# Patient Record
Sex: Male | Born: 1960 | Race: White | Hispanic: No | Smoking: Never smoker
Health system: Southern US, Community
[De-identification: ages and names within clinical notes are randomized; demographics above are authoritative.]

## PROBLEM LIST (undated history)

## (undated) DIAGNOSIS — F419 Anxiety disorder, unspecified: Secondary | ICD-10-CM

## (undated) DIAGNOSIS — F4310 Post-traumatic stress disorder, unspecified: Secondary | ICD-10-CM

## (undated) DIAGNOSIS — F639 Impulse disorder, unspecified: Secondary | ICD-10-CM

## (undated) DIAGNOSIS — S069X9A Unspecified intracranial injury with loss of consciousness of unspecified duration, initial encounter: Secondary | ICD-10-CM

## (undated) DIAGNOSIS — F329 Major depressive disorder, single episode, unspecified: Secondary | ICD-10-CM

## (undated) DIAGNOSIS — F32A Depression, unspecified: Secondary | ICD-10-CM

## (undated) DIAGNOSIS — S069XAA Unspecified intracranial injury with loss of consciousness status unknown, initial encounter: Secondary | ICD-10-CM

## (undated) DIAGNOSIS — K5792 Diverticulitis of intestine, part unspecified, without perforation or abscess without bleeding: Secondary | ICD-10-CM

## (undated) DIAGNOSIS — E039 Hypothyroidism, unspecified: Secondary | ICD-10-CM

## (undated) DIAGNOSIS — I4891 Unspecified atrial fibrillation: Secondary | ICD-10-CM

## (undated) HISTORY — PX: MOUTH SURGERY: SHX715

## (undated) HISTORY — DX: Unspecified atrial fibrillation: I48.91

## (undated) HISTORY — DX: Impulse disorder, unspecified: F63.9

## (undated) HISTORY — DX: Diverticulitis of intestine, part unspecified, without perforation or abscess without bleeding: K57.92

## (undated) HISTORY — DX: Post-traumatic stress disorder, unspecified: F43.10

## (undated) HISTORY — PX: HERNIA REPAIR: SHX51

---

## 2014-07-12 DIAGNOSIS — M7540 Impingement syndrome of unspecified shoulder: Secondary | ICD-10-CM | POA: Insufficient documentation

## 2014-07-12 DIAGNOSIS — M1711 Unilateral primary osteoarthritis, right knee: Secondary | ICD-10-CM

## 2014-07-12 DIAGNOSIS — M25512 Pain in left shoulder: Secondary | ICD-10-CM | POA: Insufficient documentation

## 2014-07-12 DIAGNOSIS — M25511 Pain in right shoulder: Secondary | ICD-10-CM

## 2014-07-12 HISTORY — DX: Impingement syndrome of unspecified shoulder: M75.40

## 2014-07-12 HISTORY — DX: Pain in right shoulder: M25.511

## 2014-07-12 HISTORY — DX: Unilateral primary osteoarthritis, right knee: M17.11

## 2017-08-11 DIAGNOSIS — N529 Male erectile dysfunction, unspecified: Secondary | ICD-10-CM | POA: Insufficient documentation

## 2017-08-11 DIAGNOSIS — Z1159 Encounter for screening for other viral diseases: Secondary | ICD-10-CM

## 2017-08-11 DIAGNOSIS — E291 Testicular hypofunction: Secondary | ICD-10-CM

## 2017-08-11 DIAGNOSIS — R5382 Chronic fatigue, unspecified: Secondary | ICD-10-CM

## 2017-08-11 DIAGNOSIS — E039 Hypothyroidism, unspecified: Secondary | ICD-10-CM

## 2017-08-11 DIAGNOSIS — Z888 Allergy status to other drugs, medicaments and biological substances status: Secondary | ICD-10-CM | POA: Insufficient documentation

## 2017-08-11 DIAGNOSIS — Z6841 Body Mass Index (BMI) 40.0 and over, adult: Secondary | ICD-10-CM

## 2017-08-11 DIAGNOSIS — J309 Allergic rhinitis, unspecified: Secondary | ICD-10-CM | POA: Insufficient documentation

## 2017-08-11 HISTORY — DX: Testicular hypofunction: E29.1

## 2017-08-11 HISTORY — DX: Hypothyroidism, unspecified: E03.9

## 2017-08-11 HISTORY — DX: Allergic rhinitis, unspecified: J30.9

## 2017-08-11 HISTORY — DX: Allergy status to other drugs, medicaments and biological substances: Z88.8

## 2017-08-11 HISTORY — DX: Body Mass Index (BMI) 40.0 and over, adult: Z684

## 2017-08-11 HISTORY — DX: Encounter for screening for other viral diseases: Z11.59

## 2017-08-11 HISTORY — DX: Male erectile dysfunction, unspecified: N52.9

## 2017-08-11 HISTORY — DX: Chronic fatigue, unspecified: R53.82

## 2017-08-11 HISTORY — DX: Morbid (severe) obesity due to excess calories: E66.01

## 2017-09-02 DIAGNOSIS — J300 Vasomotor rhinitis: Secondary | ICD-10-CM

## 2017-09-02 HISTORY — DX: Vasomotor rhinitis: J30.0

## 2017-10-30 DIAGNOSIS — G8929 Other chronic pain: Secondary | ICD-10-CM | POA: Insufficient documentation

## 2017-10-30 DIAGNOSIS — M25562 Pain in left knee: Secondary | ICD-10-CM | POA: Insufficient documentation

## 2017-10-30 HISTORY — DX: Other chronic pain: G89.29

## 2018-03-21 ENCOUNTER — Other Ambulatory Visit: Payer: Self-pay

## 2018-03-21 ENCOUNTER — Emergency Department (HOSPITAL_COMMUNITY): Payer: Self-pay

## 2018-03-21 ENCOUNTER — Encounter (HOSPITAL_COMMUNITY): Payer: Self-pay | Admitting: Emergency Medicine

## 2018-03-21 ENCOUNTER — Emergency Department (HOSPITAL_COMMUNITY)
Admission: EM | Admit: 2018-03-21 | Discharge: 2018-03-22 | Disposition: A | Discharge status: Home / Self Care | Payer: Self-pay | Attending: Emergency Medicine | Admitting: Emergency Medicine

## 2018-03-21 ENCOUNTER — Ambulatory Visit (HOSPITAL_COMMUNITY)
Admission: RE | Admit: 2018-03-21 | Discharge: 2018-03-21 | Disposition: A | Discharge status: Home / Self Care | Payer: Federal, State, Local not specified - Other | Attending: Psychiatry | Admitting: Psychiatry

## 2018-03-21 DIAGNOSIS — R45851 Suicidal ideations: Secondary | ICD-10-CM | POA: Insufficient documentation

## 2018-03-21 DIAGNOSIS — Z008 Encounter for other general examination: Secondary | ICD-10-CM | POA: Insufficient documentation

## 2018-03-21 DIAGNOSIS — F322 Major depressive disorder, single episode, severe without psychotic features: Secondary | ICD-10-CM | POA: Insufficient documentation

## 2018-03-21 DIAGNOSIS — G43809 Other migraine, not intractable, without status migrainosus: Secondary | ICD-10-CM | POA: Insufficient documentation

## 2018-03-21 DIAGNOSIS — F4321 Adjustment disorder with depressed mood: Secondary | ICD-10-CM | POA: Insufficient documentation

## 2018-03-21 DIAGNOSIS — Z87820 Personal history of traumatic brain injury: Secondary | ICD-10-CM | POA: Insufficient documentation

## 2018-03-21 HISTORY — DX: Unspecified intracranial injury with loss of consciousness of unspecified duration, initial encounter: S06.9X9A

## 2018-03-21 HISTORY — DX: Unspecified intracranial injury with loss of consciousness status unknown, initial encounter: S06.9XAA

## 2018-03-21 LAB — CBC WITH DIFFERENTIAL/PLATELET
BASOS PCT: 0 %
Basophils Absolute: 0 10*3/uL (ref 0.0–0.1)
Eosinophils Absolute: 0.1 10*3/uL (ref 0.0–0.7)
Eosinophils Relative: 2 %
HEMATOCRIT: 46 % (ref 39.0–52.0)
HEMOGLOBIN: 15.2 g/dL (ref 13.0–17.0)
LYMPHS ABS: 1.5 10*3/uL (ref 0.7–4.0)
LYMPHS PCT: 20 %
MCH: 29.5 pg (ref 26.0–34.0)
MCHC: 33 g/dL (ref 30.0–36.0)
MCV: 89.1 fL (ref 78.0–100.0)
Monocytes Absolute: 0.6 10*3/uL (ref 0.1–1.0)
Monocytes Relative: 8 %
NEUTROS ABS: 5.3 10*3/uL (ref 1.7–7.7)
NEUTROS PCT: 70 %
Platelets: 230 10*3/uL (ref 150–400)
RBC: 5.16 MIL/uL (ref 4.22–5.81)
RDW: 13.7 % (ref 11.5–15.5)
WBC: 7.5 10*3/uL (ref 4.0–10.5)

## 2018-03-21 LAB — COMPREHENSIVE METABOLIC PANEL
ALBUMIN: 3.9 g/dL (ref 3.5–5.0)
ALK PHOS: 76 U/L (ref 38–126)
ALT: 15 U/L (ref 0–44)
AST: 13 U/L — AB (ref 15–41)
Anion gap: 6 (ref 5–15)
BILIRUBIN TOTAL: 1 mg/dL (ref 0.3–1.2)
BUN: 20 mg/dL (ref 6–20)
CALCIUM: 8.9 mg/dL (ref 8.9–10.3)
CO2: 26 mmol/L (ref 22–32)
Chloride: 110 mmol/L (ref 98–111)
Creatinine, Ser: 0.84 mg/dL (ref 0.61–1.24)
GFR calc Af Amer: >60 mL/min (ref >60)
GLUCOSE: 100 mg/dL — AB (ref 70–99)
Potassium: 4.1 mmol/L (ref 3.5–5.1)
Sodium: 142 mmol/L (ref 135–145)
TOTAL PROTEIN: 6.9 g/dL (ref 6.5–8.1)

## 2018-03-21 LAB — RAPID URINE DRUG SCREEN, HOSP PERFORMED
Amphetamines: NOT DETECTED
BARBITURATES: NOT DETECTED
Benzodiazepines: NOT DETECTED
Cocaine: NOT DETECTED
Opiates: NOT DETECTED
Tetrahydrocannabinol: NOT DETECTED

## 2018-03-21 LAB — ETHANOL

## 2018-03-21 MED ORDER — KETOROLAC TROMETHAMINE 30 MG/ML IJ SOLN
15.0000 mg | Freq: Once | INTRAMUSCULAR | Status: AC
  Administered 2018-03-21: 15 mg via INTRAVENOUS
  Filled 2018-03-21: qty 1
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
## 2018-03-21 NOTE — ED Notes (Signed)
Pt has been [facility].
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
## 2018-03-21 NOTE — ED Notes (Signed)
ED Provider  At bedside.

## 2018-03-21 NOTE — ED Notes (Signed)
[facility] while ED RN [facility] for [facility].

## 2018-03-21 NOTE — Progress Notes (Addendum)
Patient was seen by me face-to-face and presented in the lobby as a walk-in with mobile crisis.  Patient had documented on his paper that he is having increasing right-sided head pain that has been worsening over time.  Informed patient that he need to be transported to the emergency room to be medically cleared.  AC was notified and EMS was contacted to [facility].  AC notified [facility] ED the patient was being transported over with complaint of right-sided head pain.  Marland Kitchen..Agree with NP Progress Note

## 2018-03-21 NOTE — ED Notes (Signed)
Bed: WHALD Expected date:  Expected time:  Means of arrival:  Comments: 

## 2018-03-21 NOTE — ED Notes (Signed)
Hot meal provided to patient.

## 2018-03-21 NOTE — Progress Notes (Signed)
PT IS IN HALLWAY BED, UNABLE TO ASSESS PT IN THE HALLWAY DUE TO CONFIDENTIALITY. PT'S NURSE SARAH, RN STATES THE PT IS IN THE PROCESS OF BEING TRANSFERRED TO TCU. TTS TO ASSESS ONCE PT IS IN A PRIVATE ROOM.  Ricky [name], MSW, LCSW Therapeutic Triage Specialist  [phone]

## 2018-03-21 NOTE — ED Notes (Signed)
Bed: WLPT3 Expected date:  Expected time:  Means of arrival:  Comments: 

## 2018-03-21 NOTE — ED Notes (Signed)
Bed: [id number] Expected date:  Expected time:  Means of arrival:  Comments: [place] [facility] [place] [place]

## 2018-03-21 NOTE — ED Notes (Signed)
Pt has 1x White patient belonging bag and 1x black medium duffel bag with brown handle placed at RN desk with patient labels on both. Currently no [employer] Tags available. Charge RN made aware.

## 2018-03-21 NOTE — ED Notes (Signed)
RN went in to discharge pt and patient stated that if he was discharged he "would kill himself" RN made MD aware

## 2018-03-21 NOTE — ED Notes (Signed)
ED Provider at bedside. 

## 2018-03-21 NOTE — Progress Notes (Signed)
TTS consulted with Ricky Mornharles Kober, PA who recommends inpt treatment. [facility] is reviewing for possible admission pending review of UDS and BAL. EDP Ricky [name], Ricky CowerJason, MD and pt's nurse [name], RN have been advised.  Ricky [name], MSW, LCSW Therapeutic Triage Specialist  [phone]

## 2018-03-22 DIAGNOSIS — F4321 Adjustment disorder with depressed mood: Secondary | ICD-10-CM

## 2018-03-22 HISTORY — DX: Adjustment disorder with depressed mood: F43.21

## 2018-03-22 NOTE — Clinical Social Work Note (Signed)
Clinical Social Work Assessment  Patient Details  Name: Ricky [name] MRN: [id number] Date of Birth: 06-Aug-1961  Date of referral:  03/22/18               Reason for consult:  Housing Concerns/Homelessness                Permission sought to share information with:  Other(Community Contacts and referrals) Permission granted to share information::  Yes, Verbal Permission Granted  Name::        Agency::     Relationship::     Contact Information:     Housing/Transportation Living arrangements for the past 2 months:  No permanent address, Hotel/Motel, Single Family Home Source of Information:  Patient Patient Interpreter Needed:  None Criminal Activity/Legal Involvement Pertinent to Current Situation/Hospitalization:  No - Comment as needed Significant Relationships:  Siblings(Sister in [place]) Lives with:  Other (Comment) Do you feel safe going back to the place where you live?  No Need for family participation in patient care:  No (Coment)  Care giving concerns:  Patient requested social work consult for "help," primarily situational and related to homelessness and unstable housing. Patient is medically and psychiatrically cleared and is adequate for discharge.  Social Worker assessment / plan:   CSW met with patient at bedside per patient request. CSW to complete assessment and offered to complete VI-SPDAT with patient in an effort to secure long term housing. Patient declined to complete VI-SPDAT.  Patient has been homeless for approximately six months or more. Patient reports "staying here and there." Patient recently stayed with his sister in [place] for two weeks until Friday, 03/20/18. Patient reports that he left his sister's home "to get [psychiatric] help," and that his sister did not support his decision. Patient reports that he is now unwelcome to stay with his sister and stayed in a motel the past two nights (03/20/18-03/21/18).   Patient initially declined to complete  VI-SPDAT and shelter resources for [facility] and [facility], stating he wants to get back to [employer] where his boys are.   CSW met with patient again to offer [facility] and [facility] homeless resources in addition to resources for [employer] in [place] and a bus passes for [facility] and [facility] with a printed schedule.   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 Patient thanked social work for her time and efforts.  Patient/Family's Understanding of and Emotional Response to Diagnosis, Current Treatment, and Prognosis:  CSW and patient reviewed printed resource lists, bus passes and schedules ([facility] does not operate on weekends), and services available through [facility]. Patient verbalized understanding and has no further questions.   Emotional Assessment Appearance:  Appears stated age Attitude/Demeanor/Rapport:  Engaged Affect (typically observed):  Sad, Hopeless, Anxious, Appropriate, Frustrated Orientation:  Oriented to Self, Oriented to Place, Oriented to  Time, Oriented to Situation Alcohol / Substance use:  Not Applicable Psych involvement (Current and /or in the community):  No (Comment)  Discharge Needs  Concerns to be addressed:  Homelessness, Mental Health Concerns Readmission within the last 30 days:  No Current discharge risk:  Homeless Barriers to Discharge:  No Barriers Identified   Joellen Jersey, Potter 03/22/2018, 11:54 AM

## 2018-03-22 NOTE — BH Assessment (Signed)
[facility] Assessment Progress Note   Per Nanine MeansJamison Lord and Dr. Jama Flavorsobos, Social Work Consult ordered.  Patient requesting resources for housing.  After patient meets with social work, he can be discharged.

## 2018-03-22 NOTE — Consult Note (Addendum)
[facility] Psych ED Discharge  03/22/2018 11:50 AM Ricky [name]  MRN:  [id number] Principal Problem: Adjustment disorder with depressed mood Discharge Diagnoses:  Patient Active Problem List   Diagnosis Date Noted  . Adjustment disorder with depressed mood [F43.21] 03/22/2018    Priority: High    Subjective: 57 yo male who presented to the ED with requests for help with housing and mental health issues.  He reports feeling suicidal when he is stressed.  Anxious with an increase due to financial issues, decreases with support.  3/10 anxiety at this time with mild depression.  He is now homeless after a divorce six months agoa and left [facility] [name] to come here but shelters were full. Met with Education officer, museum but declined help for shelter, wants to return to [facility] [name].  No suicidal/homicidal ideations, hallucinations, or substance abuse.  Stable for discharge.  Total Time spent with patient: 45 minutes  Past Psychiatric History: TBI  Past Medical History:  Past Medical History:  Diagnosis Date  . TBI (traumatic brain injury) [facility])    History reviewed. No pertinent surgical history. Family History: No family history on file. Family Psychiatric  History: none Social History:  Social History   Substance and Sexual Activity  Alcohol Use Not on file    Social History   Substance and Sexual Activity  Drug Use Not on file   Social History   Socioeconomic History  . Marital status: Divorced    Spouse name: Not on file  . Number of children: Not on file  . Years of education: Not on file  . Highest education level: Not on file  Occupational History  . Not on file  Social Needs  . Financial resource strain: Not on file  . Food insecurity:    Worry: Not on file    Inability: Not on file  . Transportation needs:    Medical: Not on file    Non-medical: Not on file  Tobacco Use  . Smoking status: Not on file  Substance and Sexual Activity  . Alcohol use: Not on file  . Drug use: Not on  file  . Sexual activity: Not on file  Lifestyle  . Physical activity:    Days per week: Not on file    Minutes per session: Not on file  . Stress: Not on file  Relationships  . Social connections:    Talks on phone: Not on file    Gets together: Not on file    Attends religious service: Not on file    Active member of club or organization: Not on file    Attends meetings of clubs or organizations: Not on file    Relationship status: Not on file  Other Topics Concern  . Not on file  Social History Narrative  . Not on file    Has this patient used any form of tobacco in the last 30 days? (Cigarettes, Smokeless Tobacco, Cigars, and/or Pipes)None--denies  Current Medications: No current facility-administered medications for this encounter.    Current Outpatient Medications  Medication Sig Dispense Refill  . fluticasone (FLONASE) 50 MCG/ACT nasal spray Place 2 sprays into both nostrils 2 (two) times daily.    Marland Kitchen levothyroxine (SYNTHROID, LEVOTHROID) 50 MCG tablet Take 50 mcg by mouth daily.  99  . testosterone cypionate (DEPOTESTOSTERONE CYPIONATE) 200 MG/ML injection Inject 1 mL into the muscle every 14 (fourteen) days.  3   PTA Medications:  (Not in a hospital admission)  Musculoskeletal: Strength & Muscle Tone: within normal  limits Gait & Station: normal Patient leans: N/A  Psychiatric Specialty Exam: Physical Exam  Nursing note and vitals reviewed. Constitutional: He is oriented to person, place, and time. He appears well-developed and well-nourished.  HENT:  Head: Normocephalic.  Neck: Normal range of motion.  Respiratory: Effort normal.  Musculoskeletal: Normal range of motion.  Neurological: He is alert and oriented to person, place, and time.  Psychiatric: His speech is normal and behavior is normal. Judgment and thought content normal. His mood appears anxious. His affect is blunt. Cognition and memory are normal.    Review of Systems  Psychiatric/Behavioral:  The patient is nervous/anxious.   All other systems reviewed and are negative.   Blood pressure 114/81, pulse 62, temperature 97.6 F (36.4 C), temperature source Oral, resp. rate 18, SpO2 98 %.There is no height or weight on file to calculate BMI.  General Appearance: Casual  Eye Contact:  Good  Speech:  Normal Rate  Volume:  Normal  Mood:  Anxious  Affect:  Congruent  Thought Process:  Coherent and Descriptions of Associations: Intact  Orientation:  Full (Time, Place, and Person)  Thought Content:  WDL and Logical  Suicidal Thoughts:  No  Homicidal Thoughts:  No  Memory:  Immediate;   Fair Recent;   Fair Remote;   Fair  Judgement:  Fair  Insight:  Fair  Psychomotor Activity:  Normal  Concentration:  Concentration: Fair and Attention Span: Fair  Recall:  [employer] of Knowledge:  Fair  Language:  Good  Akathisia:  No  Handed:  Right  AIMS (if indicated):     Assets:  Leisure Time Physical Health Resilience  ADL's:  Intact  Cognition:  Impaired,  Mild  Sleep:        Demographic Factors:  Male and Caucasian  Loss Factors: Financial problems/change in socioeconomic status  Historical Factors: NA  Risk Reduction Factors:   Sense of responsibility to family and Positive social support  Continued Clinical Symptoms:  Anxiety, mild  Cognitive Features That Contribute To Risk:  None    Suicide Risk:  Minimal: No identifiable suicidal ideation.  Patients presenting with no risk factors but with morbid ruminations; may be classified as minimal risk based on the severity of the depressive symptoms  Follow-up Information    Schedule an appointment as soon as possible for a visit  with Your primary provider.   Why:  Refer to phone number below to obtain PCP if needed          Plan Of Care/Follow-up recommendations:  Activity:  as toleraetd Diet:  heart healthy diet  Disposition: discharge home Waylan Boga, NP 03/22/2018, 11:50 AM   Agree with NP  assessment

## 2018-03-22 NOTE — Progress Notes (Signed)
CSW met with patient at bedside per patient request. CSW to complete assessment and offered to complete VI-SPDAT with patient in an effort to secure long term housing. Patient declined to complete VI-SPDAT, as he wishes to return to [facility].  CSW met with patient again to offer [facility] homeless resources in addition to resources for [employer] in [place] and a bus passes for [employer] and [facility] with a printed schedule.  CSW and patient reviewed printed resource lists, bus passes and schedules ([facility] does not operate on weekends), and services available through [facility]. Patient verbalized understanding and has no further questions. Resource list and bus passes were left with patient.  Patient states he will contact his ex-wife in an effort to obtain a ride back to [place] today, but will otherwise take the [facility] bus tomorrow.  CSW signing off, no other social work needs identified. Please reconsult if additional social work needs arise.  Stephanie Acre, [place] Social Worker [phone]

## 2018-03-23 ENCOUNTER — Other Ambulatory Visit: Payer: Self-pay

## 2018-03-23 ENCOUNTER — Encounter (HOSPITAL_COMMUNITY): Payer: Self-pay

## 2018-03-23 ENCOUNTER — Inpatient Hospital Stay (HOSPITAL_COMMUNITY)
Admission: AD | Admit: 2018-03-23 | Discharge: 2018-03-27 | DRG: 885 | Disposition: A | Discharge status: Home / Self Care | Payer: Federal, State, Local not specified - Other | Source: Intra-hospital | Attending: Psychiatry | Admitting: Psychiatry

## 2018-03-23 DIAGNOSIS — Z560 Unemployment, unspecified: Secondary | ICD-10-CM | POA: Diagnosis not present

## 2018-03-23 DIAGNOSIS — F4310 Post-traumatic stress disorder, unspecified: Secondary | ICD-10-CM | POA: Diagnosis present

## 2018-03-23 DIAGNOSIS — Z87820 Personal history of traumatic brain injury: Secondary | ICD-10-CM | POA: Diagnosis not present

## 2018-03-23 DIAGNOSIS — F639 Impulse disorder, unspecified: Secondary | ICD-10-CM | POA: Diagnosis not present

## 2018-03-23 DIAGNOSIS — Z62810 Personal history of physical and sexual abuse in childhood: Secondary | ICD-10-CM | POA: Diagnosis present

## 2018-03-23 DIAGNOSIS — Z79890 Hormone replacement therapy (postmenopausal): Secondary | ICD-10-CM

## 2018-03-23 DIAGNOSIS — Z7951 Long term (current) use of inhaled steroids: Secondary | ICD-10-CM | POA: Diagnosis not present

## 2018-03-23 DIAGNOSIS — R45851 Suicidal ideations: Secondary | ICD-10-CM | POA: Diagnosis present

## 2018-03-23 DIAGNOSIS — E230 Hypopituitarism: Secondary | ICD-10-CM | POA: Diagnosis present

## 2018-03-23 DIAGNOSIS — E039 Hypothyroidism, unspecified: Secondary | ICD-10-CM | POA: Diagnosis present

## 2018-03-23 DIAGNOSIS — F332 Major depressive disorder, recurrent severe without psychotic features: Principal | ICD-10-CM | POA: Diagnosis present

## 2018-03-23 DIAGNOSIS — F4321 Adjustment disorder with depressed mood: Secondary | ICD-10-CM | POA: Diagnosis present

## 2018-03-23 HISTORY — DX: Anxiety disorder, unspecified: F41.9

## 2018-03-23 HISTORY — DX: Major depressive disorder, single episode, unspecified: F32.9

## 2018-03-23 HISTORY — DX: Depression, unspecified: F32.A

## 2018-03-23 HISTORY — DX: Hypothyroidism, unspecified: E03.9

## 2018-03-23 HISTORY — DX: Major depressive disorder, recurrent severe without psychotic features: F33.2

## 2018-03-23 MED ORDER — MAGNESIUM HYDROXIDE 400 MG/5ML PO SUSP: 30.0000 mL | Freq: Every day | ORAL | Status: DC | PRN

## 2018-03-23 MED ORDER — ACETAMINOPHEN 325 MG PO TABS
650.0000 mg | ORAL_TABLET | Freq: Four times a day (QID) | ORAL | Status: DC | PRN
  Administered 2018-03-25: 650 mg via ORAL
  Filled 2018-03-23: qty 2

## 2018-03-23 MED ORDER — HYDROXYZINE HCL 25 MG PO TABS
25.0000 mg | ORAL_TABLET | Freq: Three times a day (TID) | ORAL | Status: DC | PRN
  Administered 2018-03-24: 25 mg via ORAL
  Filled 2018-03-23 (×2): qty 10
  Filled 2018-03-23: qty 1

## 2018-03-23 MED ORDER — TRAZODONE HCL 50 MG PO TABS
50.0000 mg | ORAL_TABLET | Freq: Every evening | ORAL | Status: DC | PRN
  Filled 2018-03-23: qty 7

## 2018-03-23 MED ORDER — LEVOTHYROXINE SODIUM 50 MCG PO TABS
50.0000 ug | ORAL_TABLET | Freq: Every day | ORAL | Status: DC
  Administered 2018-03-24 – 2018-03-27 (×4): 50 ug via ORAL
  Filled 2018-03-23 (×2): qty 1
  Filled 2018-03-23: qty 7
  Filled 2018-03-23: qty 1
  Filled 2018-03-23: qty 2
  Filled 2018-03-23 (×2): qty 1

## 2018-03-23 MED ORDER — FLUTICASONE PROPIONATE 50 MCG/ACT NA SUSP
2.0000 | Freq: Two times a day (BID) | NASAL | Status: DC
  Administered 2018-03-24 – 2018-03-27 (×8): 2 via NASAL
  Filled 2018-03-23 (×2): qty 16

## 2018-03-23 MED ORDER — ALUM & MAG HYDROXIDE-SIMETH 200-200-20 MG/5ML PO SUSP
30.0000 mL | ORAL | Status: DC | PRN
  Administered 2018-03-24: 30 mL via ORAL
  Filled 2018-03-23: qty 30
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
## 2018-03-23 NOTE — Tx Team (Signed)
Initial Treatment Plan 03/23/2018 11:39 PM Ricky BodoBruce Mollett BJY:[id number]    PATIENT STRESSORS: Financial difficulties Loss of wife 6 months ago Marital or family conflict Occupational concerns   PATIENT STRENGTHS: Ability for insight Capable of independent living Barrister's clerkCommunication skills Motivation for treatment/growth Physical Health Supportive family/friends   PATIENT IDENTIFIED PROBLEMS:    " Depression and anxiety has been going on for 30 years"   " I have anger outbursts and my wife thinks I'm Bipolar"   " I need help"               DISCHARGE CRITERIA:  Ability to meet basic life and health needs Improved stabilization in mood, thinking, and/or behavior Reduction of life-threatening or endangering symptoms to within safe limits  PRELIMINARY DISCHARGE PLAN: Outpatient therapy Placement in alternative living arrangements  PATIENT/FAMILY INVOLVEMENT: This treatment plan has been presented to and reviewed with the patient, Ricky BodoBruce [name], and/or family member, .  The patient and family have been given the opportunity to ask questions and make suggestions.  Andres Egeritchett, Michelangelo Rindfleisch Hundley, RN 03/23/2018, 11:39 PM
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
## 2018-03-24 DIAGNOSIS — F4310 Post-traumatic stress disorder, unspecified: Secondary | ICD-10-CM

## 2018-03-24 DIAGNOSIS — F639 Impulse disorder, unspecified: Secondary | ICD-10-CM

## 2018-03-24 LAB — LIPID PANEL
CHOLESTEROL: 133 mg/dL (ref 0–200)
HDL: 25 mg/dL — AB (ref >40)
LDL CALC: 94 mg/dL (ref 0–99)
TRIGLYCERIDES: 68 mg/dL (ref <150)
Total CHOL/HDL Ratio: 5.3 RATIO
VLDL: 14 mg/dL (ref 0–40)

## 2018-03-24 LAB — URINALYSIS, COMPLETE (UACMP) WITH MICROSCOPIC
Bacteria, UA: NONE SEEN
Bilirubin Urine: NEGATIVE
Glucose, UA: NEGATIVE mg/dL
Hgb urine dipstick: NEGATIVE
Ketones, ur: NEGATIVE mg/dL
LEUKOCYTES UA: NEGATIVE
Nitrite: NEGATIVE
Protein, ur: NEGATIVE mg/dL
SPECIFIC GRAVITY, URINE: 1.029 (ref 1.005–1.030)
pH: 5 (ref 5.0–8.0)

## 2018-03-24 LAB — TSH: TSH: 1.949 u[IU]/mL (ref 0.350–4.500)

## 2018-03-24 MED ORDER — OXCARBAZEPINE 150 MG PO TABS
75.0000 mg | ORAL_TABLET | Freq: Two times a day (BID) | ORAL | Status: DC
  Administered 2018-03-24 – 2018-03-25 (×3): 75 mg via ORAL
  Filled 2018-03-24 (×4): qty 0.5
  Filled 2018-03-24: qty 1
  Filled 2018-03-24 (×3): qty 0.5

## 2018-03-24 MED ORDER — SERTRALINE HCL 25 MG PO TABS
25.0000 mg | ORAL_TABLET | Freq: Every day | ORAL | Status: DC
  Administered 2018-03-24 – 2018-03-25 (×2): 25 mg via ORAL
  Filled 2018-03-24 (×5): qty 1
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
## 2018-03-24 NOTE — [facility] Group Notes (Signed)
[facility] LCSW Group Therapy Note  Date/Time: 03/24/18, 1315  Type of Therapy/Topic:  Group Therapy:  Feelings about Diagnosis  Participation Level:  Minimal   Mood:pleasant   Description of Group:    This group will allow patients to explore their thoughts and feelings about diagnoses they have received. Patients will be guided to explore their level of understanding and acceptance of these diagnoses. Facilitator will encourage patients to process their thoughts and feelings about the reactions of others to their diagnosis, and will guide patients in identifying ways to discuss their diagnosis with significant others in their lives. This group will be process-oriented, with patients participating in exploration of their own experiences as well as giving and receiving support and challenge from other group members.   Therapeutic Goals: 1. Patient will demonstrate understanding of diagnosis as evidence by identifying two or more symptoms of the disorder:  2. Patient will be able to express two feelings regarding the diagnosis 3. Patient will demonstrate ability to communicate their needs through discussion and/or role plays  Summary of Patient Progress:Pt was attentive throughout group and made several comments but mostly observed the group discussion.         Therapeutic Modalities:   Cognitive Behavioral Therapy Brief Therapy Feelings Identification   Daleen SquibbGreg Aylah Yeary, LCSW
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
## 2018-03-24 NOTE — Progress Notes (Signed)
Adult Psychoeducational Group Note  Date:  03/24/2018 Time:  9:34 PM  Group Topic/Focus:  Wrap-Up Group:   The focus of this group is to help patients review their daily goal of treatment and discuss progress on daily workbooks.  Participation Level:  Active  Participation Quality:  Appropriate  Affect:  Appropriate  Cognitive:  Alert and Oriented  Insight: Appropriate  Engagement in Group:  Improving  Modes of Intervention:  Clarification, Exploration and Support  Additional Comments:  Pt verbalized that his working on getting himself better, getting out and seeing his boys, and getting his life back together. Pt verbalized that something positive was talking to his peers and making new friends. Pt rated his day a 6.   Akeen Ledyard, Randal Bubaerri Lee
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
## 2018-03-25 LAB — TSH: TSH: 1.9 u[IU]/mL (ref 0.350–4.500)

## 2018-03-25 LAB — HEMOGLOBIN A1C
Hgb A1c MFr Bld: 5.4 % (ref 4.8–5.6)
MEAN PLASMA GLUCOSE: 108 mg/dL

## 2018-03-25 MED ORDER — OXCARBAZEPINE 150 MG PO TABS
150.0000 mg | ORAL_TABLET | Freq: Two times a day (BID) | ORAL | Status: DC
  Administered 2018-03-25 – 2018-03-27 (×5): 150 mg via ORAL
  Filled 2018-03-25 (×5): qty 1
  Filled 2018-03-25 (×2): qty 14
  Filled 2018-03-25: qty 1

## 2018-03-25 MED ORDER — SERTRALINE HCL 50 MG PO TABS
50.0000 mg | ORAL_TABLET | Freq: Every day | ORAL | Status: DC
Start: 2018-03-26 — End: 2018-03-27
  Administered 2018-03-26 – 2018-03-27 (×2): 50 mg via ORAL
  Filled 2018-03-25: qty 1
  Filled 2018-03-25: qty 7
  Filled 2018-03-25 (×2): qty 1

## 2018-03-25 NOTE — Progress Notes (Signed)
Nursing Progress Note: 7p-7a D: Pt currently presents with a depressed/sad affect and behavior. Interacting appropriately with the milieu. Pt reports good sleep during the previous night with current medication regimen. Pt did  attend wrap-up group.  A: Pt provided with medications per providers orders. Pt's labs and vitals were monitored throughout the night. Pt supported emotionally and encouraged to express concerns and questions. Pt educated on medications.  R: Pt's safety ensured with 15 minute and environmental checks. Pt currently denies HI and AVH and endorse passive SI. Pt verbally contracts to seek staff if SI,HI, or AVH occurs and to consult with staff before acting on any harmful thoughts. Will continue to monitor.

## 2018-03-25 NOTE — Tx Team (Signed)
Interdisciplinary Treatment and Diagnostic Plan Update  03/25/2018 Time of Session: [id number] Ricky BodoBruce Knoop MRN: [id number]  Principal Diagnosis: MDD, recurrent, severe  Secondary Diagnoses: Active Problems:   Severe recurrent major depression without psychotic features (HCC)   Posttraumatic stress disorder   Impulse control disorder   Current Medications:  Current Facility-Administered Medications  Medication Dose Route Frequency Provider Last Rate Last Dose  . acetaminophen (TYLENOL) tablet 650 mg  650 mg Oral Q6H PRN Nira ConnBerry, Jason A, NP   650 mg at 03/25/18 0751  . alum & mag hydroxide-simeth (MAALOX/MYLANTA) 200-200-20 MG/5ML suspension 30 mL  30 mL Oral Q4H PRN Nira ConnBerry, Jason A, NP   30 mL at 03/24/18 0807  . fluticasone (FLONASE) 50 MCG/ACT nasal spray 2 spray  2 spray Each Nare BID Nira ConnBerry, Jason A, NP   2 spray at 03/25/18 0752  . hydrOXYzine (ATARAX/VISTARIL) tablet 25 mg  25 mg Oral TID PRN Jackelyn PolingBerry, Jason A, NP   25 mg at 03/24/18 0815  . levothyroxine (SYNTHROID, LEVOTHROID) tablet 50 mcg  50 mcg Oral QAC breakfast Nira ConnBerry, Jason A, NP   50 mcg at 03/25/18 0751  . magnesium hydroxide (MILK OF MAGNESIA) suspension 30 mL  30 mL Oral Daily PRN Nira ConnBerry, Jason A, NP      . OXcarbazepine (TRILEPTAL) tablet 75 mg  75 mg Oral BID Antonieta Pertlary, Greg Lawson, MD   75 mg at 03/25/18 0751  . sertraline (ZOLOFT) tablet 25 mg  25 mg Oral Daily Antonieta Pertlary, Greg Lawson, MD   25 mg at 03/25/18 0751  . traZODone (DESYREL) tablet 50 mg  50 mg Oral QHS PRN Jackelyn PolingBerry, Jason A, NP       PTA Medications: Medications Prior to Admission  Medication Sig Dispense Refill Last Dose  . fluticasone (FLONASE) 50 MCG/ACT nasal spray Place 2 sprays into both nostrils 2 (two) times daily.   03/21/2018 at Unknown time  . levothyroxine (SYNTHROID, LEVOTHROID) 50 MCG tablet Take 50 mcg by mouth daily.  99 03/20/2018 at Unknown time  . testosterone cypionate (DEPOTESTOSTERONE CYPIONATE) 200 MG/ML injection Inject 1 mL into the muscle every 14  (fourteen) days.  3 03/11/2018    Patient Stressors: Financial difficulties Loss of wife 6 months ago Marital or family conflict Occupational concerns  Patient Strengths: Ability for insight Capable of independent living Barrister's clerkCommunication skills Motivation for treatment/growth Physical Health Supportive family/friends  Treatment Modalities: Medication Management, Group therapy, Case management,  1 to 1 session with clinician, Psychoeducation, Recreational therapy.   Physician Treatment Plan for Primary Diagnosis: MDD, recurrent, severe Long Term Goal(s): Improvement in symptoms so as ready for discharge Improvement in symptoms so as ready for discharge   Short Term Goals: Ability to identify changes in lifestyle to reduce recurrence of condition will improve Ability to verbalize feelings will improve Ability to disclose and discuss suicidal ideas Ability to demonstrate self-control will improve Ability to identify and develop effective coping behaviors will improve Ability to maintain clinical measurements within normal limits will improve Ability to identify triggers associated with substance abuse/mental health issues will improve Ability to identify changes in lifestyle to reduce recurrence of condition will improve Ability to verbalize feelings will improve Ability to disclose and discuss suicidal ideas Ability to demonstrate self-control will improve Ability to identify and develop effective coping behaviors will improve Ability to maintain clinical measurements within normal limits will improve Ability to identify triggers associated with substance abuse/mental health issues will improve  Medication Management: Evaluate patient's response, side effects, and tolerance of medication regimen.  Therapeutic Interventions: 1 to 1 sessions, Unit Group sessions and Medication administration.  Evaluation of Outcomes: Progressing  Physician Treatment Plan for Secondary Diagnosis:  Active Problems:   Severe recurrent major depression without psychotic features (HCC)   Posttraumatic stress disorder   Impulse control disorder  Long Term Goal(s): Improvement in symptoms so as ready for discharge Improvement in symptoms so as ready for discharge   Short Term Goals: Ability to identify changes in lifestyle to reduce recurrence of condition will improve Ability to verbalize feelings will improve Ability to disclose and discuss suicidal ideas Ability to demonstrate self-control will improve Ability to identify and develop effective coping behaviors will improve Ability to maintain clinical measurements within normal limits will improve Ability to identify triggers associated with substance abuse/mental health issues will improve Ability to identify changes in lifestyle to reduce recurrence of condition will improve Ability to verbalize feelings will improve Ability to disclose and discuss suicidal ideas Ability to demonstrate self-control will improve Ability to identify and develop effective coping behaviors will improve Ability to maintain clinical measurements within normal limits will improve Ability to identify triggers associated with substance abuse/mental health issues will improve     Medication Management: Evaluate patient's response, side effects, and tolerance of medication regimen.  Therapeutic Interventions: 1 to 1 sessions, Unit Group sessions and Medication administration.  Evaluation of Outcomes: Progressing   RN Treatment Plan for Primary Diagnosis: MDD, recurrent, severe Long Term Goal(s): Knowledge of disease and therapeutic regimen to maintain health will improve  Short Term Goals: Ability to remain free from injury will improve and Ability to verbalize frustration and anger appropriately will improve  Medication Management: RN will administer medications as ordered by provider, will assess and evaluate patient's response and provide education  to patient for prescribed medication. RN will report any adverse and/or side effects to prescribing provider.  Therapeutic Interventions: 1 on 1 counseling sessions, Psychoeducation, Medication administration, Evaluate responses to treatment, Monitor vital signs and CBGs as ordered, Perform/monitor CIWA, COWS, AIMS and Fall Risk screenings as ordered, Perform wound care treatments as ordered.  Evaluation of Outcomes: Progressing   LCSW Treatment Plan for Primary Diagnosis: MDD, recurrent, severe Long Term Goal(s): Safe transition to appropriate next level of care at discharge, Engage patient in therapeutic group addressing interpersonal concerns.  Short Term Goals: Engage patient in aftercare planning with referrals and resources, Increase social support and Increase emotional regulation  Therapeutic Interventions: Assess for all discharge needs, 1 to 1 time with Social worker, Explore available resources and support systems, Assess for adequacy in community support network, Educate family and significant other(s) on suicide prevention, Complete Psychosocial Assessment, Interpersonal group therapy.  Evaluation of Outcomes: Progressing   Progress in Treatment: Attending groups: Yes. Participating in groups: Yes. Taking medication as prescribed: Yes. Toleration medication: Yes. Family/Significant other contact made: No, will contact:  will contact pt's exwife with his consent Patient understands diagnosis: Yes. Discussing patient identified problems/goals with staff: Yes. Medical problems stabilized or resolved: Yes. Denies suicidal/homicidal ideation: Yes. Issues/concerns per patient self-inventory: No. Other: n/a   New problem(s) identified: No, Describe:  n/a  New Short Term/Long Term Goal(s):  medication management for mood stabilization; elimination of SI thoughts; development of comprehensive mental wellness/sobriety plan.   Patient Goals:  To get help with depression and anger  outbursts.  Discharge Plan or Barriers: CSW assessing for appropriate referrals. Pt is focused on housing in [place] [facility] pamphlet, Mobile Crisis information, and AA/NA information provided to patient for additional community  support and resources.   Reason for Continuation of Hospitalization: Anxiety Depression Medication stabilization Suicidal ideation  Estimated Length of Stay: Monday, 03/30/18  Attendees: Patient: 03/25/2018 11:02 AM  Physician: Dr. Jola Babinskilary MD 03/25/2018 11:02 AM  Nursing: Rayfield Citizenaroline RN Patrice RN 03/25/2018 11:02 AM  RN Care Manager:x 03/25/2018 11:02 AM  Social Worker: Corrie MckusickHeather Mirranda Monrroy LCSW 03/25/2018 11:02 AM  Recreational Therapist: x 03/25/2018 11:02 AM  Other: Armandina StammerAgnes Nwoko NP; Lawson FiscalLori NP 03/25/2018 11:02 AM  Other:  03/25/2018 11:02 AM  Other: 03/25/2018 11:02 AM    Scribe for Treatment Team: Rona RavensHeather S Tamar Lipscomb, LCSW 03/25/2018 11:02 AM

## 2018-03-25 NOTE — Progress Notes (Signed)
[facility] MD Progress Note  03/25/2018 2:41 PM Ricky [name]  MRN:  [id number] Subjective: Patient is seen and examined.  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  He seen in follow-up.  He stated he feels a little bit better today, but still considers that he significantly anxious.  He is meeting with his pastor tomorrow and trying to find out about living circumstances.  We had a long discussion today about his disorder, and the role that he needs to plan this, and the reality of what the medicines were able to do in this.  He denied any side effects to his current medications.  He stated that he is not feeling suicidal right now. Principal Problem: <principal problem not specified> Diagnosis:   Patient Active Problem List   Diagnosis Date Noted  . Posttraumatic stress disorder [F43.10]   . Impulse control disorder [F63.9]   . Severe recurrent major depression without psychotic features (HCC) [F33.2] 03/23/2018  . Adjustment disorder with depressed mood [F43.21] 03/22/2018   Total Time spent with patient: 15 minutes  Past Psychiatric History: See admission H&P  Past Medical History:  Past Medical History:  Diagnosis Date  . Anxiety   . Depression   . Hypothyroidism   . TBI (traumatic brain injury) [facility])     Past Surgical History:  Procedure Laterality Date  . MOUTH SURGERY     Family History: History reviewed. No pertinent family history. Family Psychiatric  History: See admission H&P Social History:  Social History   Substance and Sexual Activity  Alcohol Use Not Currently     Social History   Substance and Sexual Activity  Drug Use Not Currently    Social History   Socioeconomic History  . Marital status: Divorced    Spouse name: Not on file  . Number of children: 3  . Years of  education: Not on file  . Highest education level: Not on file  Occupational History  . Not on file  Social Needs  . Financial resource strain: Very hard  . Food insecurity:    Worry: Sometimes true    Inability: Sometimes true  . Transportation needs:    Medical: Not on file    Non-medical: Not on file  Tobacco Use  . Smoking status: Never Smoker  . Smokeless tobacco: Never Used  Substance and Sexual Activity  . Alcohol use: Not Currently  . Drug use: Not Currently  . Sexual activity: Not Currently  Lifestyle  . Physical activity:    Days per week: Not on file    Minutes per session: Not on file  . Stress: Rather much  Relationships  . Social connections:    Talks on phone: Not on file    Gets together: Not on file    Attends religious service: Not on file    Active member of club or organization: Not on file    Attends meetings of clubs or organizations: Not on file    Relationship status: Not on file  Other Topics Concern  . Not on file  Social History Narrative  . Not on file   Additional Social History:    Pain Medications: See PTA medication list Prescriptions: See PTA medication list Over the Counter: See PTA medication list History of alcohol / drug use?: No history of alcohol / drug abuse  Sleep: Fair  Appetite:  Good  Current Medications: Current Facility-Administered Medications  Medication Dose Route Frequency Provider Last Rate Last Dose  . acetaminophen (TYLENOL) tablet 650 mg  650 mg Oral Q6H PRN Nira Conn A, NP   650 mg at 03/25/18 0751  . alum & mag hydroxide-simeth (MAALOX/MYLANTA) 200-200-20 MG/5ML suspension 30 mL  30 mL Oral Q4H PRN Nira Conn A, NP   30 mL at 03/24/18 0807  . fluticasone (FLONASE) 50 MCG/ACT nasal spray 2 spray  2 spray Each Nare BID Nira Conn A, NP   2 spray at 03/25/18 0752  . hydrOXYzine (ATARAX/VISTARIL) tablet 25 mg  25 mg Oral TID PRN Jackelyn Poling, NP   25 mg at 03/24/18 0815  .  levothyroxine (SYNTHROID, LEVOTHROID) tablet 50 mcg  50 mcg Oral QAC breakfast Nira Conn A, NP   50 mcg at 03/25/18 0751  . magnesium hydroxide (MILK OF MAGNESIA) suspension 30 mL  30 mL Oral Daily PRN Nira Conn A, NP      . OXcarbazepine (TRILEPTAL) tablet 150 mg  150 mg Oral BID Antonieta Pert, MD      . Melene Muller ON 03/26/2018] sertraline (ZOLOFT) tablet 50 mg  50 mg Oral Daily Antonieta Pert, MD      . traZODone (DESYREL) tablet 50 mg  50 mg Oral QHS PRN Jackelyn Poling, NP        Lab Results:  Results for orders placed or performed during the hospital encounter of 03/23/18 (from the past 48 hour(s))  Hemoglobin A1c     Status: None   Collection Time: 03/24/18  6:25 AM  Result Value Ref Range   Hgb A1c MFr Bld 5.4 4.8 - 5.6 %    Comment: (NOTE)         Prediabetes: 5.7 - 6.4         Diabetes: >6.4         Glycemic control for adults with diabetes: <7.0    Mean Plasma Glucose 108 mg/dL    Comment: (NOTE) Performed At: [facility] [address] [place], [place] [id number] Jolene Schimke MD [id number]   TSH     Status: None   Collection Time: 03/24/18  6:25 AM  Result Value Ref Range   TSH 1.949 0.350 - 4.500 uIU/mL    Comment: Performed by a 3rd Generation assay with a functional sensitivity of <=0.01 uIU/mL. Performed at [facility], [address] [place], [place] [address]   Lipid panel     Status: Abnormal   Collection Time: 03/24/18  6:25 AM  Result Value Ref Range   Cholesterol 133 0 - 200 mg/dL   Triglycerides 68 <725 mg/dL   HDL 25 (L) >36 mg/dL   Total CHOL/HDL Ratio 5.3 RATIO   VLDL 14 0 - 40 mg/dL   LDL Cholesterol 94 0 - 99 mg/dL    Comment:        Total Cholesterol/HDL:CHD Risk Coronary Heart Disease Risk Table                     Men   Women  1/2 Average Risk   3.4   3.3  Average Risk       5.0   4.4  2 X Average Risk   9.6   7.1  3 X Average Risk  23.4   11.0        Use the calculated Patient Ratio above and  the CHD Risk Table  to determine the patient's CHD Risk.        ATP III CLASSIFICATION (LDL):  <100     mg/dL   Optimal  [id number]  mg/dL   Near or Above                    Optimal  130-159  mg/dL   Borderline  [id number]  mg/dL   High  >811>190     mg/dL   Very High Performed at [facility], [address] [place], [place] [address]   Urinalysis, Complete w Microscopic     Status: Abnormal   Collection Time: 03/24/18  9:31 AM  Result Value Ref Range   Color, Urine YELLOW YELLOW   APPearance TURBID (A) CLEAR   Specific Gravity, Urine 1.029 1.005 - 1.030   pH 5.0 5.0 - 8.0   Glucose, UA NEGATIVE NEGATIVE mg/dL   Hgb urine dipstick NEGATIVE NEGATIVE   Bilirubin Urine NEGATIVE NEGATIVE   Ketones, ur NEGATIVE NEGATIVE mg/dL   Protein, ur NEGATIVE NEGATIVE mg/dL   Nitrite NEGATIVE NEGATIVE   Leukocytes, UA NEGATIVE NEGATIVE   Bacteria, UA NONE SEEN NONE SEEN   Amorphous Crystal PRESENT     Comment: Performed at [facility], [address] [place], [place] [address]  TSH     Status: None   Collection Time: 03/25/18  6:44 AM  Result Value Ref Range   TSH 1.900 0.350 - 4.500 uIU/mL    Comment: Performed by a 3rd Generation assay with a functional sensitivity of <=0.01 uIU/mL. Performed at [facility], [address] [place], [place] [address]     Blood Alcohol level:  Lab Results  Component Value Date   ETH <10 03/21/2018    Metabolic Disorder Labs: Lab Results  Component Value Date   HGBA1C 5.4 03/24/2018   MPG 108 03/24/2018   No results found for: PROLACTIN Lab Results  Component Value Date   CHOL 133 03/24/2018   TRIG 68 03/24/2018   HDL 25 (L) 03/24/2018   CHOLHDL 5.3 03/24/2018   VLDL 14 03/24/2018   LDLCALC 94 03/24/2018    Physical Findings: AIMS: Facial and Oral Movements Muscles of Facial Expression: None, normal Lips and Perioral Area: None, normal Jaw: None, normal Tongue: None, normal,Extremity  Movements Upper (arms, wrists, hands, fingers): None, normal Lower (legs, knees, ankles, toes): None, normal, Trunk Movements Neck, shoulders, hips: None, normal, Overall Severity Severity of abnormal movements (highest score from questions above): None, normal Incapacitation due to abnormal movements: None, normal Patient's awareness of abnormal movements (rate only patient's report): No Awareness, Dental Status Current problems with teeth and/or dentures?: No Does patient usually wear dentures?: No  CIWA:  CIWA-Ar Total: 2 COWS:  COWS Total Score: 1  Musculoskeletal: Strength & Muscle Tone: within normal limits Gait & Station: normal Patient leans: N/A  Psychiatric Specialty Exam: Physical Exam  Nursing note and vitals reviewed. Constitutional: He is oriented to person, place, and time. He appears well-developed and well-nourished.  HENT:  Head: Normocephalic and atraumatic.  Respiratory: Effort normal and breath sounds normal.  Neurological: He is alert and oriented to person, place, and time.    ROS  Blood pressure 110/80, pulse 91, temperature 98.3 F (36.8 C), temperature source Oral, resp. rate 18, height 6' (1.829 m), weight (!) 142.4 kg.Body mass index is 42.59 kg/m.  General Appearance: Casual  Eye Contact:  Fair  Speech:  Normal Rate  Volume:  Normal  Mood:  Anxious  Affect:  Congruent  Thought Process:  Coherent  Orientation:  Full (Time, Place, and Person)  Thought Content:  Logical  Suicidal Thoughts:  Yes.  without intent/plan  Homicidal Thoughts:  No  Memory:  Immediate;   Fair Recent;   Fair Remote;   Fair  Judgement:  Intact  Insight:  Fair  Psychomotor Activity:  Normal  Concentration:  Concentration: Fair and Attention Span: Fair  Recall:  [employer]Fair  Fund of Knowledge:  Fair  Language:  Fair  Akathisia:  Negative  Handed:  Right  AIMS (if indicated):     Assets:  Communication Skills Desire for Improvement Physical Health Resilience  ADL's:   Intact  Cognition:  WNL  Sleep:  Number of Hours: 6.75     Treatment Plan Summary: Daily contact with patient to assess and evaluate symptoms and progress in treatment, Medication management and Plan : Patient is seen and examined.  Patient is a 57 year old male with the above-stated past psychiatric history seen in follow-up.  He is doing a little bit better today.  I will go on and increase his sertraline to 50 mg p.o. daily, and is well increase the Trileptal 250 mg p.o. twice daily.  No other changes in his medications.  Antonieta PertGreg Lawson Clary, MD 03/25/2018, 2:41 PM

## 2018-03-25 NOTE — Therapy (Signed)
Occupational Therapy Group Note  Date:  03/25/2018 Time:  3:55 PM  Group Topic/Focus:  Self Care  Participation Level:  Active  Participation Quality:  Appropriate  Affect:  Flat  Cognitive:  Appropriate  Insight: Improving  Engagement in Group:  Engaged  Modes of Intervention:  Activity, Discussion, Education and Socialization  Additional Comments:    S: "I need to get back into my workout routine when I lost 80 lbs"  O: Education given on self care and its importance in supporting mental health. Self care assessment given as a self rating scale for pt to identify areas of strength and weakness. Discussion given in how to improve current practices.  ?  A: Pt presents to group with flat affect, engaged and participatory throughout. Pt identifies physical areas to improve self care, focusing on healthy routines.  ?  P: Education given on self care, handouts given to facilitate carryover into community.   Ricky [name], MSOT, OTR/L  [name] 03/25/2018, 3:55 PM

## 2018-03-25 NOTE — Progress Notes (Signed)
Adult Psychoeducational Group Note  Date:  03/25/2018 Time:  10:52 PM  Group Topic/Focus:  Wrap-Up Group:   The focus of this group is to help patients review their daily goal of treatment and discuss progress on daily workbooks.  Participation Level:  Active  Participation Quality:  Appropriate  Affect:  Appropriate  Cognitive:  Alert and Oriented  Insight: Appropriate  Engagement in Group:  Developing/Improving  Modes of Intervention:  Clarification, Exploration and Support  Additional Comments:  Pt verbalized that he had a pretty good day. Pt verbalized that he had an encouraging talk with his ex wife. Pt rated his day a 7.   Pairlee Sawtell, Randal Bubaerri Lee 03/25/2018, 10:52 PM

## 2018-03-25 NOTE — Plan of Care (Signed)
  Problem: Coping: Goal: Ability to verbalize frustrations and anger appropriately will improve Outcome: Progressing Goal: Ability to demonstrate self-control will improve Outcome: Progressing   

## 2018-03-25 NOTE — Progress Notes (Signed)
Pt presents with a flat affect and sad mood. Pt rated on his self inventory sheet: depression 4/10, anxiety 6/10 and hopelessness 6/10. Pt endorses passive SI with no plan or intent. Pt verbalized feeling safe here in the hospital. Pt expressed that the his Renato Gailsastor is going to come visit him and help him find housing. Pt expressed feeling depressed of the unknown, because he's homeless. Pt compliant with taking meds and denies any side effects.   Orders reviewed with pt. Verbal support provided. Pt encouraged to attend groups. 15 minute checks performed for safety.   Pt compliant with tx plan.

## 2018-03-26 MED ORDER — LOPERAMIDE HCL 2 MG PO CAPS
2.0000 mg | ORAL_CAPSULE | ORAL | Status: DC | PRN
  Filled 2018-03-26: qty 1

## 2018-03-26 NOTE — Plan of Care (Signed)
Problem: Activity: Goal: Interest or engagement in leisure activities will improve Outcome: Progressing   Problem: Safety: Goal: Ability to disclose and discuss suicidal ideas will improve Outcome: Progressing D: Pt A & X4. Denies SI, HI, AVH and pain when assessed. Presents with flat affect and depressed mood. Reports he slept well last night with good appetite, normal energy and good concentration level. Rates his depression, anxiety and hopelessness 7/10, however, per pt "I feel the medicines are working, my mood is improving some, I'm just worried about where I'll stay when I leave here". Pt states he's hopeful that his pastor can help him "he's coming to visit me and we will talk about it, he said he'll help me". Pt's goal is to "focus on the future". Observed in scheduled groups on unit and was engaged.  A: Emotional support offered. Encouraged pt to voice concerns and comply with current treatment regimen. All medications given as ordered with verbal education and effects monitored. New order received for Imodium (see emar) for c/o loose stools X3 this morning. Safety checks maintained without self harm gestures or outburst to note thus far.  R: Pt remains medication compliant denies adverse drug reactions when assessed. Cooperative with care on and off unit. Tolerates all PO intake well. POC remains effective for safety and mood stability.

## 2018-03-26 NOTE — Progress Notes (Signed)
Nursing Progress Note: 7p-7a D: Pt currently presents with a depressed/sad affect and behavior.  Interacting appropriately with the milieu. Pt reports good sleep during the previous night with current medication regimen.  A: Pt provided with medications per providers orders. Pt's labs and vitals were monitored throughout the night. Pt supported emotionally and encouraged to express concerns and questions. Pt educated on medications.  R: Pt's safety ensured with 15 minute and environmental checks. Pt currently denies HI and AVH and endorses passive SI. Pt verbally contracts to seek staff if SI,HI, or AVH occurs and to consult with staff before acting on any harmful thoughts. Will continue to monitor.

## 2018-03-26 NOTE — Progress Notes (Signed)
[facility] MD Progress Note  03/26/2018 12:54 PM Ricky [name]  MRN:  [id number] Subjective: Patient is seen and examined.  Patient is a 57 year old male with a past psychiatric history significant for posttraumatic stress disorder, impulse control disorder.  He is seen in follow-up.  Patient reports some nausea and diarrhea.  Most likely this is related to the sertraline.  He stated otherwise he feels better.  He feels like his anxiety is under better control.  He feels like his meds are doing well outside of the nausea and diarrhea.  I told him today that if the GI symptoms continue that we would stop the medications and switch to something else.  He denied any suicidal ideation this morning. Principal Problem: <principal problem not specified> Diagnosis:   Patient Active Problem List   Diagnosis Date Noted  . Posttraumatic stress disorder [F43.10]   . Impulse control disorder [F63.9]   . Severe recurrent major depression without psychotic features (HCC) [F33.2] 03/23/2018  . Adjustment disorder with depressed mood [F43.21] 03/22/2018   Total Time spent with patient: 15 minutes  Past Psychiatric History: See admission H&P  Past Medical History:  Past Medical History:  Diagnosis Date  . Anxiety   . Depression   . Hypothyroidism   . TBI (traumatic brain injury) [facility](HCC)     Past Surgical History:  Procedure Laterality Date  . MOUTH SURGERY     Family History: History reviewed. No pertinent family history. Family Psychiatric  History: See admission H&P Social History:  Social History   Substance and Sexual Activity  Alcohol Use Not Currently     Social History   Substance and Sexual Activity  Drug Use Not Currently    Social History   Socioeconomic History  . Marital status: Divorced    Spouse name: Not on file  . Number of children: 3  . Years of education: Not on file  . Highest education level: Not on file  Occupational History  . Not on file  Social Needs  . Financial  resource strain: Very hard  . Food insecurity:    Worry: Sometimes true    Inability: Sometimes true  . Transportation needs:    Medical: Not on file    Non-medical: Not on file  Tobacco Use  . Smoking status: Never Smoker  . Smokeless tobacco: Never Used  Substance and Sexual Activity  . Alcohol use: Not Currently  . Drug use: Not Currently  . Sexual activity: Not Currently  Lifestyle  . Physical activity:    Days per week: Not on file    Minutes per session: Not on file  . Stress: Rather much  Relationships  . Social connections:    Talks on phone: Not on file    Gets together: Not on file    Attends religious service: Not on file    Active member of club or organization: Not on file    Attends meetings of clubs or organizations: Not on file    Relationship status: Not on file  Other Topics Concern  . Not on file  Social History Narrative  . Not on file   Additional Social History:    Pain Medications: See PTA medication list Prescriptions: See PTA medication list Over the Counter: See PTA medication list History of alcohol / drug use?: No history of alcohol / drug abuse                    Sleep: Good  Appetite:  Good  Current Medications: Current Facility-Administered Medications  Medication Dose Route Frequency Provider Last Rate Last Dose  . acetaminophen (TYLENOL) tablet 650 mg  650 mg Oral Q6H PRN Nira Conn A, NP   650 mg at 03/25/18 0751  . alum & mag hydroxide-simeth (MAALOX/MYLANTA) 200-200-20 MG/5ML suspension 30 mL  30 mL Oral Q4H PRN Nira Conn A, NP   30 mL at 03/24/18 0807  . fluticasone (FLONASE) 50 MCG/ACT nasal spray 2 spray  2 spray Each Nare BID Nira Conn A, NP   2 spray at 03/26/18 0748  . hydrOXYzine (ATARAX/VISTARIL) tablet 25 mg  25 mg Oral TID PRN Nira Conn A, NP   25 mg at 03/24/18 0815  . levothyroxine (SYNTHROID, LEVOTHROID) tablet 50 mcg  50 mcg Oral QAC breakfast Nira Conn A, NP   50 mcg at 03/26/18 0748  .  loperamide (IMODIUM) capsule 2 mg  2 mg Oral PRN Antonieta Pert, MD      . magnesium hydroxide (MILK OF MAGNESIA) suspension 30 mL  30 mL Oral Daily PRN Nira Conn A, NP      . OXcarbazepine (TRILEPTAL) tablet 150 mg  150 mg Oral BID Antonieta Pert, MD   150 mg at 03/26/18 0748  . sertraline (ZOLOFT) tablet 50 mg  50 mg Oral Daily Antonieta Pert, MD   50 mg at 03/26/18 0748  . traZODone (DESYREL) tablet 50 mg  50 mg Oral QHS PRN Jackelyn Poling, NP        Lab Results:  Results for orders placed or performed during the hospital encounter of 03/23/18 (from the past 48 hour(s))  TSH     Status: None   Collection Time: 03/25/18  6:44 AM  Result Value Ref Range   TSH 1.900 0.350 - 4.500 uIU/mL    Comment: Performed by a 3rd Generation assay with a functional sensitivity of <=0.01 uIU/mL. Performed at [facility], [address] [place], [place] [address]     Blood Alcohol level:  Lab Results  Component Value Date   ETH <10 03/21/2018    Metabolic Disorder Labs: Lab Results  Component Value Date   HGBA1C 5.4 03/24/2018   MPG 108 03/24/2018   No results found for: PROLACTIN Lab Results  Component Value Date   CHOL 133 03/24/2018   TRIG 68 03/24/2018   HDL 25 (L) 03/24/2018   CHOLHDL 5.3 03/24/2018   VLDL 14 03/24/2018   LDLCALC 94 03/24/2018    Physical Findings: AIMS: Facial and Oral Movements Muscles of Facial Expression: None, normal Lips and Perioral Area: None, normal Jaw: None, normal Tongue: None, normal,Extremity Movements Upper (arms, wrists, hands, fingers): None, normal Lower (legs, knees, ankles, toes): None, normal, Trunk Movements Neck, shoulders, hips: None, normal, Overall Severity Severity of abnormal movements (highest score from questions above): None, normal Incapacitation due to abnormal movements: None, normal Patient's awareness of abnormal movements (rate only patient's report): No Awareness, Dental Status Current  problems with teeth and/or dentures?: No Does patient usually wear dentures?: No  CIWA:  CIWA-Ar Total: 2 COWS:  COWS Total Score: 1  Musculoskeletal: Strength & Muscle Tone: within normal limits Gait & Station: normal Patient leans: N/A  Psychiatric Specialty Exam: Physical Exam  Nursing note and vitals reviewed. Constitutional: He is oriented to person, place, and time. He appears well-developed and well-nourished.  HENT:  Head: Normocephalic and atraumatic.  Respiratory: Effort normal.  Neurological: He is alert and oriented to person, place, and time.  ROS  Blood pressure 118/81, pulse 87, temperature 97.8 F (36.6 C), temperature source Oral, resp. rate 16, height 6' (1.829 m), weight (!) 142.4 kg.Body mass index is 42.59 kg/m.  General Appearance: Casual  Eye Contact:  Good  Speech:  Normal Rate  Volume:  Normal  Mood:  Euthymic  Affect:  Congruent  Thought Process:  Coherent  Orientation:  Full (Time, Place, and Person)  Thought Content:  Logical  Suicidal Thoughts:  No  Homicidal Thoughts:  No  Memory:  Immediate;   Fair Recent;   Fair Remote;   Fair  Judgement:  Intact  Insight:  Fair  Psychomotor Activity:  Normal  Concentration:  Concentration: Fair and Attention Span: Fair  Recall:  [employer]Fair  Fund of Knowledge:  Fair  Language:  Good  Akathisia:  Negative  Handed:  Right  AIMS (if indicated):     Assets:  Desire for Improvement Physical Health Resilience Talents/Skills  ADL's:  Intact  Cognition:  WNL  Sleep:  Number of Hours: 6.75     Treatment Plan Summary: Daily contact with patient to assess and evaluate symptoms and progress in treatment, Medication management and Plan : Patient is seen and examined.  Patient is a 57 year old male with the above-stated past psychiatric history seen in follow-up.  He is improving.  He stated his anxiety and irritability is decreased.  His sleep is improved.  He has been having some mild nausea, and also some  diarrhea.  I told him that I thought this was probably secondary to the Zoloft.  I told him that if he had any more diarrhea to let me know and we would stop the Zoloft, and start a different antidepressant/antianxiety medication.  He is going to discuss things with his pastor tonight with regard to housing, he is hoping to find some place to be able to stay.  If he is able to do this we may anticipate discharge in 1 to 2 days.  Otherwise no changes to his current medications.  Antonieta PertGreg Lawson Clary, MD 03/26/2018, 12:54 PM

## 2018-03-26 NOTE — [facility] Group Notes (Signed)
[facility] LCSW Group Therapy Note  Date/Time: 03/26/18, 1315  Type of Therapy/Topic:  Group Therapy:  Balance in Life  Participation Level:  active  Description of Group:    This group will address the concept of balance and how it feels and looks when one is unbalanced. Patients will be encouraged to process areas in their lives that are out of balance, and identify reasons for remaining unbalanced. Facilitators will guide patients utilizing problem- solving interventions to address and correct the stressor making their life unbalanced. Understanding and applying boundaries will be explored and addressed for obtaining  and maintaining a balanced life. Patients will be encouraged to explore ways to assertively make their unbalanced needs known to significant others in their lives, using other group members and facilitator for support and feedback.  Therapeutic Goals: 1. Patient will identify two or more emotions or situations they have that consume much of in their lives. 2. Patient will identify signs/triggers that life has become out of balance:  3. Patient will identify two ways to set boundaries in order to achieve balance in their lives:  4. Patient will demonstrate ability to communicate their needs through discussion and/or role plays  Summary of Patient Progress: Pt shared that  are area of his life that are out of balance.  Pt was attentive throughout group discussion and made several contributions. :          Therapeutic Modalities:   Cognitive Behavioral Therapy Solution-Focused Therapy Assertiveness Training  Daleen SquibbGreg Devinne Epstein, LCSW
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
## 2018-03-26 NOTE — Progress Notes (Signed)
Adult Psychoeducational Group Note  Date:  03/26/2018 Time:  10:30 PM  Group Topic/Focus:  Wrap-Up Group:   The focus of this group is to help patients review their daily goal of treatment and discuss progress on daily workbooks.  Participation Level:  Active  Participation Quality:  Appropriate  Affect:  Appropriate  Cognitive:  Appropriate  Insight: Appropriate  Engagement in Group:  Engaged  Modes of Intervention:  Discussion  Additional Comments:  Pt stated he had a good day.  Pt's pastors came for a visit and they are going to help him with finding adequate housing.  Pt rated the day at a 8/10.  Keyly Baldonado 03/26/2018, 10:30 PM

## 2018-03-27 MED ORDER — OXCARBAZEPINE 150 MG PO TABS: 150.0000 mg | ORAL_TABLET | Freq: Two times a day (BID) | ORAL | 0 refills | Status: DC

## 2018-03-27 MED ORDER — SERTRALINE HCL 50 MG PO TABS: 50.0000 mg | ORAL_TABLET | Freq: Every day | ORAL | 0 refills | Status: DC

## 2018-03-27 MED ORDER — HYDROXYZINE HCL 25 MG PO TABS: 25.0000 mg | ORAL_TABLET | Freq: Three times a day (TID) | ORAL | 0 refills | Status: DC | PRN

## 2018-03-27 MED ORDER — TRAZODONE HCL 50 MG PO TABS: 50.0000 mg | ORAL_TABLET | Freq: Every evening | ORAL | 0 refills | Status: DC | PRN

## 2018-03-27 NOTE — Progress Notes (Signed)
Pt discharged to lobby. Pt was stable and appreciative at that time. All papers, med samples and prescriptions were given and valuables returned. Verbal understanding expressed. Denies SI/HI and A/VH. Pt given opportunity to express concerns and ask questions.

## 2018-03-27 NOTE — Progress Notes (Signed)
  [facility] :  Will you be returning to the same living situation after discharge:  No. At discharge, do you have transportation home?: Yes,  ex-wife Do you have the ability to pay for your medications: No. Will work with [facility].  Release of information consent forms completed and in the chart;  Patient's signature needed at discharge.  Patient to Follow up at: Follow-up Information    [facility] Go on 03/30/2018.   Why:  Please attend your intake appt on Monday, 03/30/18, at 10:15am.  Please bring photo ID, social security card, and proof of any household income. Contact information: [address] [place] [address] [phone]           Next level of care provider has access to [facility] Link:no  Safety Planning and Suicide Prevention discussed: Yes,  ex wife  Have you used any form of tobacco in the last 30 days? (Cigarettes, Smokeless Tobacco, Cigars, and/or Pipes): No  Has patient been referred to the Quitline?: N/A patient is not a smoker  Patient has been referred for addiction treatment: N/A  Lorri FrederickWierda, Miryam Mcelhinney Jon, LCSW 03/27/2018, 3:43 PM
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
## 2018-03-27 NOTE — [facility] Suicide Risk Assessment (Signed)
[facility]   Principal Problem: <principal problem not specified> Discharge Diagnoses:  Patient Active Problem List   Diagnosis Date Noted  . Posttraumatic stress disorder [F43.10]   . Impulse control disorder [F63.9]   . Severe recurrent major depression without psychotic features (HCC) [F33.2] 03/23/2018  . Adjustment disorder with depressed mood [F43.21] 03/22/2018    Total Time spent with patient: 15 minutes  Musculoskeletal: Strength & Muscle Tone: within normal limits Gait & Station: normal Patient leans: N/A  Psychiatric Specialty Exam: Review of Systems  All other systems reviewed and are negative.   Blood pressure 123/83, pulse 90, temperature 98.7 F (37.1 C), temperature source Oral, resp. rate (!) 24, height 6' (1.829 m), weight (!) 142.4 kg, SpO2 98 %.Body mass index is 42.59 kg/m.  General Appearance: Casual  Eye Contact::  Fair  Speech:  Normal Rate409  Volume:  Normal  Mood:  Anxious  Affect:  Congruent  Thought Process:  Coherent and Descriptions of Associations: Intact  Orientation:  Full (Time, Place, and Person)  Thought Content:  Logical  Suicidal Thoughts:  No  Homicidal Thoughts:  No  Memory:  Immediate;   Fair Recent;   Fair Remote;   Fair  Judgement:  Fair  Insight:  Fair  Psychomotor Activity:  Normal  Concentration:  Fair  Recall:  Fis[employer]Fair  Fund of Knowledge:Fair  Language: Fair  Akathisia:  Negative  Handed:  Right  AIMS (if indicated):     Assets:  Desire for Improvement Physical Health Resilience  Sleep:  Number of Hours: 6.75  Cognition: WNL  ADL's:  Intact   Mental Status Per Nursing Assessment::   On Admission:  Self-harm thoughts, Thoughts of violence towards others  Demographic Factors:  Male, Divorced or widowed, Caucasian, Low socioeconomic status and Unemployed  Loss Factors: NA  Historical Factors: Impulsivity  Risk Reduction Factors:   Positive coping skills or problem solving  skills  Continued Clinical Symptoms:  Depression:   Impulsivity  Cognitive Features That Contribute To Risk:  None    Suicide Risk:  Minimal: No identifiable suicidal ideation.  Patients presenting with no risk factors but with morbid ruminations; may be classified as minimal risk based on the severity of the depressive symptoms    Plan Of Care/Follow-up recommendations:  Activity:  ad lib  Antonieta PertGreg Lawson Zacharey Jensen, MD 03/27/2018, 8:33 AM

## 2018-04-14 ENCOUNTER — Encounter (HOSPITAL_COMMUNITY): Payer: Self-pay | Admitting: *Deleted

## 2018-04-14 ENCOUNTER — Other Ambulatory Visit: Payer: Self-pay | Admitting: Registered Nurse

## 2018-04-14 ENCOUNTER — Inpatient Hospital Stay (HOSPITAL_COMMUNITY)
Admission: AD | Admit: 2018-04-14 | Discharge: 2018-04-18 | DRG: 885 | Disposition: A | Discharge status: Home / Self Care | Payer: Federal, State, Local not specified - Other | Source: Intra-hospital | Attending: Psychiatry | Admitting: Psychiatry

## 2018-04-14 ENCOUNTER — Other Ambulatory Visit: Payer: Self-pay

## 2018-04-14 DIAGNOSIS — F332 Major depressive disorder, recurrent severe without psychotic features: Principal | ICD-10-CM | POA: Diagnosis present

## 2018-04-14 DIAGNOSIS — Z79899 Other long term (current) drug therapy: Secondary | ICD-10-CM

## 2018-04-14 DIAGNOSIS — Z7951 Long term (current) use of inhaled steroids: Secondary | ICD-10-CM | POA: Diagnosis not present

## 2018-04-14 DIAGNOSIS — R45851 Suicidal ideations: Secondary | ICD-10-CM | POA: Diagnosis present

## 2018-04-14 DIAGNOSIS — Z79890 Hormone replacement therapy (postmenopausal): Secondary | ICD-10-CM | POA: Diagnosis not present

## 2018-04-14 DIAGNOSIS — G4700 Insomnia, unspecified: Secondary | ICD-10-CM | POA: Diagnosis present

## 2018-04-14 DIAGNOSIS — F4310 Post-traumatic stress disorder, unspecified: Secondary | ICD-10-CM | POA: Diagnosis present

## 2018-04-14 DIAGNOSIS — E039 Hypothyroidism, unspecified: Secondary | ICD-10-CM | POA: Diagnosis present

## 2018-04-14 HISTORY — DX: Major depressive disorder, recurrent severe without psychotic features: F33.2

## 2018-04-14 MED ORDER — TRAZODONE HCL 50 MG PO TABS
50.0000 mg | ORAL_TABLET | Freq: Every evening | ORAL | Status: DC | PRN
  Administered 2018-04-14: 50 mg via ORAL
  Filled 2018-04-14 (×5): qty 1

## 2018-04-14 MED ORDER — FLUTICASONE PROPIONATE 50 MCG/ACT NA SUSP
2.0000 | Freq: Two times a day (BID) | NASAL | Status: DC
  Administered 2018-04-14 – 2018-04-18 (×8): 2 via NASAL
  Filled 2018-04-14 (×2): qty 16

## 2018-04-14 MED ORDER — OXCARBAZEPINE 150 MG PO TABS
150.0000 mg | ORAL_TABLET | Freq: Two times a day (BID) | ORAL | Status: DC
  Administered 2018-04-14 – 2018-04-18 (×8): 150 mg via ORAL
  Filled 2018-04-14 (×3): qty 1
  Filled 2018-04-14: qty 14
  Filled 2018-04-14 (×4): qty 1
  Filled 2018-04-14: qty 14
  Filled 2018-04-14: qty 1
  Filled 2018-04-14: qty 14
  Filled 2018-04-14: qty 1
  Filled 2018-04-14: qty 14
  Filled 2018-04-14 (×4): qty 1

## 2018-04-14 MED ORDER — LEVOTHYROXINE SODIUM 50 MCG PO TABS
50.0000 ug | ORAL_TABLET | Freq: Every day | ORAL | Status: DC
  Administered 2018-04-15 – 2018-04-18 (×4): 50 ug via ORAL
  Filled 2018-04-14 (×2): qty 1
  Filled 2018-04-14: qty 7
  Filled 2018-04-14: qty 2
  Filled 2018-04-14 (×3): qty 1
  Filled 2018-04-14: qty 7

## 2018-04-14 MED ORDER — HYDROXYZINE HCL 25 MG PO TABS
25.0000 mg | ORAL_TABLET | Freq: Three times a day (TID) | ORAL | Status: DC | PRN
  Filled 2018-04-14: qty 10

## 2018-04-14 NOTE — Tx Team (Signed)
Initial Treatment Plan 04/14/2018 7:03 PM [name] JYN:[id number]    PATIENT STRESSORS: Financial difficulties Marital or family conflict Medication change or noncompliance   PATIENT STRENGTHS: Ability for insight Average or above average intelligence Capable of independent living General fund of knowledge   PATIENT IDENTIFIED PROBLEMS: Depression Suicidal thoughts "I need to get on something other than zoloft"                     DISCHARGE CRITERIA:  Ability to meet basic life and health needs Improved stabilization in mood, thinking, and/or behavior Reduction of life-threatening or endangering symptoms to within safe limits Verbal commitment to aftercare and medication compliance  PRELIMINARY DISCHARGE PLAN: Attend aftercare/continuing care group Return to previous living arrangement  PATIENT/FAMILY INVOLVEMENT: This treatment plan has been presented to and reviewed with the patient, Ricky [name], and/or family member, .  The patient and family have been given the opportunity to ask questions and make suggestions.  Samentha Perham, [place], [place] 04/14/2018, 7:03 PM

## 2018-04-14 NOTE — Progress Notes (Signed)
Ricky [name] is a 57 year old male pt admitted on voluntary basis from [facility]. He reports that he was taking zoloft and spoke about how he was feeling worse while he was taking it. He reports that he took an overdose and then called 911 and the ambulance came and picked him up and spoke about how he realized that he didn't want to die. He denies any SI on admission and is able to contract for safety on the unit. He reports that he has not followed up for any of his appointments and spoke about how he needed to get back to work instead. He denies any substance abuse issues. He reports that he lives with his ex-wife and reports that he will be able to go back there once he is discharged. [name] was escorted to the unit, oriented to the milieu and safety maintained.

## 2018-04-15 DIAGNOSIS — F332 Major depressive disorder, recurrent severe without psychotic features: Principal | ICD-10-CM

## 2018-04-15 MED ORDER — TRAZODONE HCL 50 MG PO TABS
50.0000 mg | ORAL_TABLET | Freq: Every evening | ORAL | Status: DC | PRN
  Administered 2018-04-15 – 2018-04-17 (×3): 50 mg via ORAL
  Filled 2018-04-15 (×2): qty 1
  Filled 2018-04-15: qty 7
  Filled 2018-04-15: qty 1

## 2018-04-15 MED ORDER — CITALOPRAM HYDROBROMIDE 10 MG PO TABS
10.0000 mg | ORAL_TABLET | Freq: Every day | ORAL | Status: DC
  Administered 2018-04-15 – 2018-04-17 (×3): 10 mg via ORAL
  Filled 2018-04-15 (×5): qty 1

## 2018-04-15 NOTE — Progress Notes (Signed)
Recreation Therapy Notes  Date: 9.4.19 Time: 0930 Location: [address] Dayroom  Group Topic: Stress Management  Goal Area(s) Addresses:  Patient will verbalize importance of using healthy stress management.  Patient will identify positive emotions associated with healthy stress management.   Intervention: Stress Management  Activity :  Guided Imagery.  LRT introduced the stress management technique of guided imagery.  LRT read [name] script that allowed patients to enjoy the summer clouds.  Patients were to follow along as LRT read script to engage in activity.  Education:  Stress Management, Discharge Planning.   Education Outcome: Acknowledges edcuation/In group clarification offered/Needs additional education  Clinical Observations/Feedback: Pt did not attend group.     Ricky [name], LRT/CTRS         Ricky [name] 04/15/2018 11:03 AM 
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
## 2018-04-15 NOTE — Therapy (Signed)
Occupational Therapy Group Note  Date:  04/15/2018 Time:  2:53 PM  Group Topic/Focus:  Stress Management  Participation Level:  Active  Participation Quality:  Appropriate  Affect:  Blunted  Cognitive:  Appropriate  Insight: Limited  Engagement in Group:  Engaged  Modes of Intervention:  Activity, Discussion, Education and Socialization  Additional Comments:    S: "I like to read"  O: Education given on stress management and healthy coping mechanisms. Pt encouraged to brainstorm with other peers and discuss what has worked in the past vs what has not. Pts further encouraged to discuss new coping stress management strategies to implement this date. Art activity made to display preferred coping mechanisms, along with incorporating the stress management outlet of coloring/art. Gratitude journaling and coloring handouts given at end of session.  A: Pt presents to group with blunted affect, affect improved when discussing preferred activities. Pt brainstormed activities with other peers, stating reading magazines and listening to music are her preferred stress management tools this date. Pt engaged in art activity, needing min A to follow instruction.  P: Pt provided with education on stress management activities to implement into daily routine. Handouts given to facilitate carryover when reintegrating into community   [facility], [place], OTR/L  [employer] 04/15/2018, 2:53 PM

## 2018-04-15 NOTE — Progress Notes (Signed)
Patient ID: Ricky [name], male   DOB: 06-09-1961, 57 y.o.   MRN: [id number]   D: Patient denies SI/HI and auditory and visual hallucinations. Patient has a depressed mood and affect. Complained of sinus congestion. Attending groups.  A: Patient given emotional support from RN. Patient given medications per MD orders. Patient encouraged to attend groups and unit activities. Patient encouraged to come to staff with any questions or concerns.  R: Patient remains cooperative and appropriate. Will continue to monitor patient for safety.

## 2018-04-15 NOTE — H&P (Signed)
Psychiatric Admission Assessment Adult  Patient Identification: Ricky [name] MRN:  [id number] Date of Evaluation:  04/15/2018 Chief Complaint: feeling more depressed recently   Principal Diagnosis: MDD (major depressive disorder), recurrent episode, severe (HCC) Diagnosis:   Patient Active Problem List   Diagnosis Date Noted  . MDD (major depressive disorder), recurrent episode, severe (HCC) [F33.2] 04/14/2018  . Posttraumatic stress disorder [F43.10]   . Impulse control disorder [F63.9]   . Severe recurrent major depression without psychotic features (HCC) [F33.2] 03/23/2018  . Adjustment disorder with depressed mood [F43.21] 03/22/2018   History of Present Illness:  72, lives with ex wife, has three children ( 63,78,58), currently with their mother, patient is employed. History of recent psychiatric admission to [facility] from 8/13- 8/16. At the time had presented to depression, suicidal ideations in the context of multiple losses ( divorce, loss of job). At the time was diagnosed with MDD, discharged on Trileptal, Zoloft, Trazodone . Patient states he stopped the Zoloft after he was discharged because " it was not working ".  Patient states he initially felt better, was less depressed, " doing all right", but states that over the last several days he has again felt more depressed and impulsively overdosed on Vistaril- states he took about 25. States after overdose he called 911 and was sent to ED.  Psychiatric history- history of depression, denies prior suicide attempts, one prior psychiatric admission as above. Denies history of psychosis, Does not endorse any clear history of mania, but reports history of brief episodes of angry outbursts/explosiveness, which he feels have improved since he started Trilpetal. Of note, states that he did very well on Celexa in the past, states he stopped it because he felt better, not due to any side effects.  Reports some neuro-vegetative symptoms- reports  anhedonia, decreased sleep, energy, and appetite. Denies psychotic symptoms. Denies alcohol or drug abuse  Medical History- Hypothyroidism. NKDA. Family history- mother alive, father deceased from cancer, has 9 siblings , states nephew has history of depression, substance abuse and committed suicide .  Associated Signs/Symptoms: Depression Symptoms:  depressed mood, fatigue, feelings of worthlessness/guilt, hopelessness, suicidal attempt, disturbed sleep, weight loss, decreased appetite, (Hypo) Manic Symptoms:  Denies Anxiety Symptoms:  Excessive Worry, Psychotic Symptoms:  Denies PTSD Symptoms: NA Total Time spent with patient: 45 minutes  Past Psychiatric History: One previous psych hospitalization in August 2019.  Does not follow a psychiatrist.  Was prescribed Zoloft and felt it did not help.  Reports that Trileptal 150 mg twice daily has really assisted him with staying calm.  He does report that his PCP is the one who prescribes his medications.  He is requesting Celexa as he is used in the past with great benefit.  Is the patient at risk to self? Yes.    Has the patient been a risk to self in the past 6 months? Yes.    Has the patient been a risk to self within the distant past? No.  Is the patient a risk to others? No.  Has the patient been a risk to others in the past 6 months? No.  Has the patient been a risk to others within the distant past? No.   Prior Inpatient Therapy:  as above  Prior Outpatient Therapy:    Alcohol Screening: 1. How often do you have a drink containing alcohol?: Never 2. How many drinks containing alcohol do you have on a typical day when you are drinking?: 1 or 2 3. How often do you have  six or more drinks on one occasion?: Never AUDIT-C Score: 0 4. How often during the last year have you found that you were not able to stop drinking once you had started?: Never 5. How often during the last year have you failed to do what was normally expected  from you becasue of drinking?: Never 6. How often during the last year have you needed a first drink in the morning to get yourself going after a heavy drinking session?: Never 7. How often during the last year have you had a feeling of guilt of remorse after drinking?: Never 8. How often during the last year have you been unable to remember what happened the night before because you had been drinking?: Never 9. Have you or someone else been injured as a result of your drinking?: No 10. Has a relative or friend or a doctor or another health worker been concerned about your drinking or suggested you cut down?: No Alcohol Use Disorder Identification Test Final Score (AUDIT): 0 Intervention/Follow-up: AUDIT Score <7 follow-up not indicated Substance Abuse History in the last 12 months:  Denies alcohol or drug abuse  Consequences of Substance Abuse: Denies  Previous Psychotropic Medications: Yes  Psychological Evaluations: No  Past Medical History: hypothyroidism, NKDA Past Medical History:  Diagnosis Date  . Anxiety   . Depression   . Hypothyroidism   . TBI (traumatic brain injury) [facility])     Past Surgical History:  Procedure Laterality Date  . MOUTH SURGERY     Family History: History reviewed. No pertinent family history. Family Psychiatric  History: Denies Tobacco Screening: Have you used any form of tobacco in the last 30 days? (Cigarettes, Smokeless Tobacco, Cigars, and/or Pipes): No Social History: 24, lives with ex wife, has three children ( 16,10,96), currently with their mother, patient is employed. Social History   Substance and Sexual Activity  Alcohol Use Not Currently     Social History   Substance and Sexual Activity  Drug Use Not Currently    Additional Social History:  Allergies:  No Known Allergies Lab Results: No results found for this or any previous visit (from the past 48 hour(s)).  Blood Alcohol level:  Lab Results  Component Value Date   ETH <10  03/21/2018    Metabolic Disorder Labs:  Lab Results  Component Value Date   HGBA1C 5.4 03/24/2018   MPG 108 03/24/2018   No results found for: PROLACTIN Lab Results  Component Value Date   CHOL 133 03/24/2018   TRIG 68 03/24/2018   HDL 25 (L) 03/24/2018   CHOLHDL 5.3 03/24/2018   VLDL 14 03/24/2018   LDLCALC 94 03/24/2018    Current Medications: Current Facility-Administered Medications  Medication Dose Route Frequency Provider Last Rate Last Dose  . citalopram (CELEXA) tablet 10 mg  10 mg Oral Daily Cobos, Fernando A, MD      . fluticasone (FLONASE) 50 MCG/ACT nasal spray 2 spray  2 spray Each Nare BID Nira Conn A, NP   2 spray at 04/15/18 0737  . hydrOXYzine (ATARAX/VISTARIL) tablet 25 mg  25 mg Oral TID PRN Jackelyn Poling, NP      . levothyroxine (SYNTHROID, LEVOTHROID) tablet 50 mcg  50 mcg Oral QAC breakfast Nira Conn A, NP   50 mcg at 04/15/18 0620  . OXcarbazepine (TRILEPTAL) tablet 150 mg  150 mg Oral BID Nira Conn A, NP   150 mg at 04/15/18 0738  . traZODone (DESYREL) tablet 50 mg  50 mg Oral  QHS PRN Cobos, Rockey Situ, MD       PTA Medications: Medications Prior to Admission  Medication Sig Dispense Refill Last Dose  . fluticasone (FLONASE) 50 MCG/ACT nasal spray Place 2 sprays into both nostrils 2 (two) times daily.   Past Week at Unknown time  . hydrOXYzine (ATARAX/VISTARIL) 25 MG tablet Take 1 tablet (25 mg total) by mouth 3 (three) times daily as needed for anxiety. 30 tablet 0 Past Month at Unknown time  . levothyroxine (SYNTHROID, LEVOTHROID) 50 MCG tablet Take 50 mcg by mouth daily.  99 Past Week at Unknown time  . OXcarbazepine (TRILEPTAL) 150 MG tablet Take 1 tablet (150 mg total) by mouth 2 (two) times daily. 60 tablet 0 Past Month at Unknown time  . sertraline (ZOLOFT) 50 MG tablet Take 1 tablet (50 mg total) by mouth daily. 30 tablet 0 Past Month at Unknown time  . testosterone cypionate (DEPOTESTOSTERONE CYPIONATE) 200 MG/ML injection Inject 1 mL  into the muscle every 14 (fourteen) days.  3 Past Month at Unknown time  . traZODone (DESYREL) 50 MG tablet Take 1 tablet (50 mg total) by mouth at bedtime as needed for sleep. 30 tablet 0 Past Month at Unknown time    Musculoskeletal: Strength & Muscle Tone: within normal limits Gait & Station: normal Patient leans: N/A  Psychiatric Specialty Exam: Physical Exam  Nursing note and vitals reviewed. Constitutional: He is oriented to person, place, and time. He appears well-developed and well-nourished.  Cardiovascular: Normal rate.  Respiratory: Effort normal.  Musculoskeletal: Normal range of motion.  Neurological: He is alert and oriented to person, place, and time.  Skin: Skin is warm.    Review of Systems  Constitutional: Negative.   HENT: Negative.   Eyes: Negative.   Respiratory: Negative.   Cardiovascular: Negative.   Gastrointestinal: Negative.   Genitourinary: Negative.   Musculoskeletal: Negative.   Skin: Negative.   Neurological: Negative.   Endo/Heme/Allergies: Negative.   Psychiatric/Behavioral: Positive for depression. Negative for hallucinations, substance abuse and suicidal ideas. The patient is nervous/anxious.     Blood pressure 112/72, pulse (!) 107, temperature 98.2 F (36.8 C), temperature source Oral, resp. rate 18, height 5\' 9"  (1.753 m), weight 134.7 kg, SpO2 99 %.Body mass index is 43.86 kg/m.  General Appearance: Casual  Eye Contact:  Good  Speech:  Clear and Coherent and Normal Rate  Volume:  Normal  Mood:  Depressed  Affect:  Constricted  Thought Process:  Linear and Descriptions of Associations: Intact  Orientation:  Full (Time, Place, and Person)  Thought Content:   No hallucinations, no delusions    Suicidal Thoughts:  No currently denies suicidal ideations, denies self injurious ideations, contracts for safety on unit   Homicidal Thoughts:  No  Memory:  Immediate;   Good Recent;   Good Remote;   Good  Judgement:  Fair  Insight:  Fair   Psychomotor Activity:  Normal  Concentration:  Concentration: Good and Attention Span: Good  Recall:  Good  Fund of Knowledge:  Good  Language:  Good  Akathisia:  No  Handed:  Right  AIMS (if indicated):     Assets:  Communication Skills Desire for Improvement Financial Resources/Insurance Housing Social Support Transportation  ADL's:  Intact  Cognition:  WNL  Sleep:  Number of Hours: 6.5    Treatment Plan Summary: Daily contact with patient to assess and evaluate symptoms and progress in treatment and Medication management  Inpatient admission  Continue Synthroid for management of Hypothyroidism.  Observation Level/Precautions:  15 minute checks  Laboratory:  Reviewed/ as needed   Psychotherapy: Milieu/ Group therapy  Medications: Start Celexa 10 mgrs QDAY - patient reports history of good response to this medication. Continue Trileptal 150 mgrs BID- denies side effects and states it has helped decrease angry outbursts.  Side effects reviewed   Consultations: As needed  Discharge Concerns: -  Estimated LOS: 3-5 days  Other: Admit to 400 hall   Physician Treatment Plan for Primary Diagnosis: MDD (major depressive disorder), recurrent episode, severe (HCC) Long Term Goal(s): Improvement in symptoms so as ready for discharge  Short Term Goals: Ability to identify changes in lifestyle to reduce recurrence of condition will improve, Ability to verbalize feelings will improve, Ability to disclose and discuss suicidal ideas and Ability to demonstrate self-control will improve  Physician Treatment Plan for Secondary Diagnosis: Principal Problem:   MDD (major depressive disorder), recurrent episode, severe (HCC)  Long Term Goal(s): Improvement in symptoms so as ready for discharge  Short Term Goals: Ability to demonstrate self-control will improve, Ability to identify and develop effective coping behaviors will improve, Ability to maintain clinical measurements within normal  limits will improve and Compliance with prescribed medications will improve  I certify that inpatient services furnished can reasonably be expected to improve the patient's condition.    Maryfrances Bunnell, FNP 9/4/20191:15 PM

## 2018-04-15 NOTE — [facility] Suicide Risk Assessment (Signed)
[facility] INPATIENT:  Family/Significant Other Suicide Prevention Education  Suicide Prevention Education:  Contact Attempts: Trystan Valerio, ex wife, [phone], has been identified by the patient as the family member/significant other with whom the patient will be residing, and identified as the person(s) who will aid the patient in the event of a mental health crisis.  With written consent from the patient, two attempts were made to provide suicide prevention education, prior to and/or following the patient's discharge.  We were unsuccessful in providing suicide prevention education.  A suicide education pamphlet was given to the patient to share with family/significant other.  Date and time of first attempt:04/15/18, 1448 Date and time of second attempt:  [name], LCSW 04/15/2018, 2:48 PM

## 2018-04-15 NOTE — [facility] Group Notes (Signed)
[facility] Mental [employer] Group Therapy 04/15/2018 1:15pm  Type of Therapy: Mental Health Association Presentation  Participation Level: Active  Participation Quality: Attentive  Affect: Appropriate  Cognitive: Oriented  Insight: Developing/Improving  Engagement in Therapy: Engaged  Modes of Intervention: Discussion, Education and Socialization  Summary of Progress/Problems: Mental Health Association (MHA) Speaker came to talk about his personal journey with mental health. The pt processed ways by which to relate to the speaker. MHA speaker provided handouts and educational information pertaining to groups and services offered by the [facility]. Pt was engaged in speaker's presentation and was receptive to resources provided.    Rona Ravens, LCSW 04/15/2018 11:49 AM
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
## 2018-04-15 NOTE — Tx Team (Addendum)
Interdisciplinary Treatment and Diagnostic Plan Update  04/15/2018 Time of Session: [place] MRN: [id number]  Principal Diagnosis: MDD (major depressive disorder), recurrent episode, severe ([place])  Secondary Diagnoses: Principal Problem:   MDD (major depressive disorder), recurrent episode, severe ([place])   Current Medications:  Current Facility-Administered Medications  Medication Dose Route Frequency Provider Last Rate Last Dose  . citalopram (CELEXA) tablet 10 mg  10 mg Oral Daily Cobos, Fernando A, MD      . fluticasone (FLONASE) 50 MCG/ACT nasal spray 2 spray  2 spray Each Nare BID Lindon Romp A, NP   2 spray at 04/15/18 0737  . hydrOXYzine (ATARAX/VISTARIL) tablet 25 mg  25 mg Oral TID PRN Rozetta Nunnery, NP      . levothyroxine (SYNTHROID, LEVOTHROID) tablet 50 mcg  50 mcg Oral QAC breakfast Lindon Romp A, NP   50 mcg at 04/15/18 0620  . OXcarbazepine (TRILEPTAL) tablet 150 mg  150 mg Oral BID Lindon Romp A, NP   150 mg at 04/15/18 0738  . traZODone (DESYREL) tablet 50 mg  50 mg Oral QHS PRN Cobos, Myer Peer, MD       PTA Medications: Medications Prior to Admission  Medication Sig Dispense Refill Last Dose  . fluticasone (FLONASE) 50 MCG/ACT nasal spray Place 2 sprays into both nostrils 2 (two) times daily.   Past Week at Unknown time  . hydrOXYzine (ATARAX/VISTARIL) 25 MG tablet Take 1 tablet (25 mg total) by mouth 3 (three) times daily as needed for anxiety. 30 tablet 0 Past Month at Unknown time  . levothyroxine (SYNTHROID, LEVOTHROID) 50 MCG tablet Take 50 mcg by mouth daily.  99 Past Week at Unknown time  . OXcarbazepine (TRILEPTAL) 150 MG tablet Take 1 tablet (150 mg total) by mouth 2 (two) times daily. 60 tablet 0 Past Month at Unknown time  . sertraline (ZOLOFT) 50 MG tablet Take 1 tablet (50 mg total) by mouth daily. 30 tablet 0 Past Month at Unknown time  . testosterone cypionate (DEPOTESTOSTERONE CYPIONATE) 200 MG/ML injection Inject 1 mL into the muscle every 14  (fourteen) days.  3 Past Month at Unknown time  . traZODone (DESYREL) 50 MG tablet Take 1 tablet (50 mg total) by mouth at bedtime as needed for sleep. 30 tablet 0 Past Month at Unknown time    Patient Stressors: Financial difficulties Marital or family conflict Medication change or noncompliance  Patient Strengths: Ability for insight Average or above average intelligence Capable of independent living General fund of knowledge  Treatment Modalities: Medication Management, Group therapy, Case management,  1 to 1 session with clinician, Psychoeducation, Recreational therapy.   Physician Treatment Plan for Primary Diagnosis: MDD (major depressive disorder), recurrent episode, severe ([place]) Long Term Goal(s): Improvement in symptoms so as ready for discharge Improvement in symptoms so as ready for discharge   Short Term Goals: Ability to identify changes in lifestyle to reduce recurrence of condition will improve Ability to verbalize feelings will improve Ability to disclose and discuss suicidal ideas Ability to demonstrate self-control will improve Ability to demonstrate self-control will improve Ability to identify and develop effective coping behaviors will improve Ability to maintain clinical measurements within normal limits will improve Compliance with prescribed medications will improve  Medication Management: Evaluate patient's response, side effects, and tolerance of medication regimen.  Therapeutic Interventions: 1 to 1 sessions, Unit Group sessions and Medication administration.  Evaluation of Outcomes: Not Met  Physician Treatment Plan for Secondary Diagnosis: Principal Problem:   MDD (major depressive disorder),  recurrent episode, severe ([place])  Long Term Goal(s): Improvement in symptoms so as ready for discharge Improvement in symptoms so as ready for discharge   Short Term Goals: Ability to identify changes in lifestyle to reduce recurrence of condition will  improve Ability to verbalize feelings will improve Ability to disclose and discuss suicidal ideas Ability to demonstrate self-control will improve Ability to demonstrate self-control will improve Ability to identify and develop effective coping behaviors will improve Ability to maintain clinical measurements within normal limits will improve Compliance with prescribed medications will improve     Medication Management: Evaluate patient's response, side effects, and tolerance of medication regimen.  Therapeutic Interventions: 1 to 1 sessions, Unit Group sessions and Medication administration.  Evaluation of Outcomes: Not Met   RN Treatment Plan for Primary Diagnosis: MDD (major depressive disorder), recurrent episode, severe ([place]) Long Term Goal(s): Knowledge of disease and therapeutic regimen to maintain health will improve  Short Term Goals: Ability to identify and develop effective coping behaviors will improve and Compliance with prescribed medications will improve  Medication Management: RN will administer medications as ordered by provider, will assess and evaluate patient's response and provide education to patient for prescribed medication. RN will report any adverse and/or side effects to prescribing provider.  Therapeutic Interventions: 1 on 1 counseling sessions, Psychoeducation, Medication administration, Evaluate responses to treatment, Monitor vital signs and CBGs as ordered, Perform/monitor CIWA, COWS, AIMS and Fall Risk screenings as ordered, Perform wound care treatments as ordered.  Evaluation of Outcomes: Not Met   LCSW Treatment Plan for Primary Diagnosis: MDD (major depressive disorder), recurrent episode, severe ([place]) Long Term Goal(s): Safe transition to appropriate next level of care at discharge, Engage patient in therapeutic group addressing interpersonal concerns.  Short Term Goals: Engage patient in aftercare planning with referrals and resources, Increase  social support and Increase skills for wellness and recovery  Therapeutic Interventions: Assess for all discharge needs, 1 to 1 time with Social worker, Explore available resources and support systems, Assess for adequacy in community support network, Educate family and significant other(s) on suicide prevention, Complete Psychosocial Assessment, Interpersonal group therapy.  Evaluation of Outcomes: Not Met   Progress in Treatment: Attending groups: Yes. Participating in groups: Yes. Taking medication as prescribed: Yes. Toleration medication: Yes. Family/Significant other contact made: No, will contact:  ex wife Patient understands diagnosis: Yes. Discussing patient identified problems/goals with staff: Yes. Medical problems stabilized or resolved: Yes. Denies suicidal/homicidal ideation: Yes. Issues/concerns per patient self-inventory: No. Other: none  New problem(s) identified: No, Describe:  none  New Short Term/Long Term Goal(s):  Patient Goals:  "Get better, adjustment of my meds."  Discharge Plan or Barriers:   Reason for Continuation of Hospitalization: Depression Medication stabilization  Estimated Length of Stay: 3-5 days.  Attendees: Patient: Ricky [name] 04/15/2018   Physician: Dr. Parke Poisson, MD 04/15/2018   Nursing: Neldon Newport, RN 04/15/2018   RN Care Manager: 04/15/2018  Social Worker: Lurline Idol, LCSW 04/15/2018   Recreational Therapist:  04/15/2018   Other:  04/15/2018   Other:  04/15/2018   Other: 04/15/2018        Scribe for Treatment Team: Joanne Chars, [place] 04/15/2018 4:17 PM

## 2018-04-16 NOTE — Progress Notes (Signed)
D:  Ricky [name] has been up and visible on the unit.  He reported that today was better than yesterday.  He reported coming to the hospital to get "my meds straight."  He denied SI/HI or A/V hallucinations.  He appears to be in no physical distress.  He requested prn for sleep and trazodone was given with good relief. A:  1:1 with RN for support and encouragement.  Medications as ordered.  Q 15 minute checks maintained for safety.  Encouraged participation in group and unit activities.   R:  Ricky [name] remains safe on the unit.  We will continue to monitor the progress towards his goals.

## 2018-04-16 NOTE — [facility] Suicide Risk Assessment (Signed)
[facility] INPATIENT:  Family/Significant Other Suicide Prevention Education  Suicide Prevention Education:  Education Completed: Ricky [name], ex wife, [phone],   has been identified by the patient as the family member/significant other with whom the patient will be residing, and identified as the person(s) who will aid the patient in the event of a mental health crisis (suicidal ideations/suicide attempt).  With written consent from the patient, the family member/significant other has been provided the following suicide prevention education, prior to the and/or following the discharge of the patient.  The suicide prevention education provided includes the following:  Suicide risk factors  Suicide prevention and interventions  National Suicide Hotline telephone number  [facility] assessment telephone number  [facility] Emergency Assistance 911  [facility] and/or Residential Mobile Crisis Unit telephone number  Request made of family/significant other to:  Remove weapons (e.g., guns, rifles, knives), all items previously/currently identified as safety concern.    Remove drugs/medications (over-the-counter, prescriptions, illicit drugs), all items previously/currently identified as a safety concern.  The family member/significant other verbalizes understanding of the suicide prevention education information provided.  The family member/significant other agrees to remove the items of safety concern listed above.  Pt medication has been making him very sleepy, per Ricky [name].  The day he was admitted, he left for work like normal and then called later and said he was at the hospital.  She will encourage him to attend his follow up appt so that he can stay on his medication.  Ricky Frederick, LCSW 04/16/2018, 3:11 PM

## 2018-04-16 NOTE — Progress Notes (Signed)
[facility] MD Progress Note  04/16/2018 1:56 PM Ricky [name]  MRN:  [id number]   Subjective: Patient reports feeling " much much better" today.  He attributes improvement to being back on  Celexa which she states has been effective and well-tolerated  in the past. States his goal for admission was to be restarted on medication and that he feels this goal has been achieved.  He is now focused on discharging soon, states he misses his family and needs to return to work . At this time denies significant neurovegetative symptoms of depression and denies suicidal ideations, presents future oriented. Denies medication side effects  Objective: Patient seen and case reviewed with treatment team. 57 year old male, employed, presented for depression, suicidal thoughts, recent overdose on Vistaril.  History of recent psychiatric admission in August for depression. As above, reports significant improvement and describes improving mood.  Currently denies neurovegetative symptoms of depression or suicidal ideations and is future oriented, hoping to discharge soon. Presents calm, pleasant on approach. Tolerating medications well/denies side effects, he is now on Celexa which she states has been effective and well-tolerated during prior treatment, and on Trileptal which she reports has helped improve his overall mood and decrease angry/explosive outbursts. Some group participation, no disruptive or agitated behaviors on unit  Principal Problem: MDD (major depressive disorder), recurrent episode, severe (HCC) Diagnosis:   Patient Active Problem List   Diagnosis Date Noted  . MDD (major depressive disorder), recurrent episode, severe (HCC) [F33.2] 04/14/2018  . Posttraumatic stress disorder [F43.10]   . Impulse control disorder [F63.9]   . Severe recurrent major depression without psychotic features (HCC) [F33.2] 03/23/2018  . Adjustment disorder with depressed mood [F43.21] 03/22/2018   Total Time spent with patient:  20 minutes  Past Psychiatric History: See H&P  Past Medical History:  Past Medical History:  Diagnosis Date  . Anxiety   . Depression   . Hypothyroidism   . TBI (traumatic brain injury) [facility])     Past Surgical History:  Procedure Laterality Date  . MOUTH SURGERY     Family History: History reviewed. No pertinent family history. Family Psychiatric  History: See H&P Social History:  Social History   Substance and Sexual Activity  Alcohol Use Not Currently     Social History   Substance and Sexual Activity  Drug Use Not Currently    Social History   Socioeconomic History  . Marital status: Divorced    Spouse name: Not on file  . Number of children: 3  . Years of education: Not on file  . Highest education level: Not on file  Occupational History  . Not on file  Social Needs  . Financial resource strain: Very hard  . Food insecurity:    Worry: Sometimes true    Inability: Sometimes true  . Transportation needs:    Medical: Not on file    Non-medical: Not on file  Tobacco Use  . Smoking status: Never Smoker  . Smokeless tobacco: Never Used  Substance and Sexual Activity  . Alcohol use: Not Currently  . Drug use: Not Currently  . Sexual activity: Not Currently  Lifestyle  . Physical activity:    Days per week: Not on file    Minutes per session: Not on file  . Stress: Rather much  Relationships  . Social connections:    Talks on phone: Not on file    Gets together: Not on file    Attends religious service: Not on file    Active member  of club or organization: Not on file    Attends meetings of clubs or organizations: Not on file    Relationship status: Not on file  Other Topics Concern  . Not on file  Social History Narrative  . Not on file   Additional Social History:   Sleep: Improving  Appetite:  Good  Current Medications: Current Facility-Administered Medications  Medication Dose Route Frequency Provider Last Rate Last Dose  . citalopram  (CELEXA) tablet 10 mg  10 mg Oral Daily Juliya Magill, Rockey Situ, MD   10 mg at 04/16/18 0803  . fluticasone (FLONASE) 50 MCG/ACT nasal spray 2 spray  2 spray Each Nare BID Nira Conn A, NP   2 spray at 04/16/18 0804  . hydrOXYzine (ATARAX/VISTARIL) tablet 25 mg  25 mg Oral TID PRN Nira Conn A, NP      . levothyroxine (SYNTHROID, LEVOTHROID) tablet 50 mcg  50 mcg Oral QAC breakfast Nira Conn A, NP   50 mcg at 04/16/18 1191  . OXcarbazepine (TRILEPTAL) tablet 150 mg  150 mg Oral BID Nira Conn A, NP   150 mg at 04/16/18 0804  . traZODone (DESYREL) tablet 50 mg  50 mg Oral QHS PRN Maryellen Dowdle, Rockey Situ, MD   50 mg at 04/15/18 2113    Lab Results: No results found for this or any previous visit (from the past 48 hour(s)).  Blood Alcohol level:  Lab Results  Component Value Date   ETH <10 03/21/2018    Metabolic Disorder Labs: Lab Results  Component Value Date   HGBA1C 5.4 03/24/2018   MPG 108 03/24/2018   No results found for: PROLACTIN Lab Results  Component Value Date   CHOL 133 03/24/2018   TRIG 68 03/24/2018   HDL 25 (L) 03/24/2018   CHOLHDL 5.3 03/24/2018   VLDL 14 03/24/2018   LDLCALC 94 03/24/2018    Physical Findings: AIMS: Facial and Oral Movements Muscles of Facial Expression: None, normal Lips and Perioral Area: None, normal Jaw: None, normal Tongue: None, normal,Extremity Movements Upper (arms, wrists, hands, fingers): None, normal Lower (legs, knees, ankles, toes): None, normal, Trunk Movements Neck, shoulders, hips: None, normal, Overall Severity Severity of abnormal movements (highest score from questions above): None, normal Incapacitation due to abnormal movements: None, normal Patient's awareness of abnormal movements (rate only patient's report): No Awareness, Dental Status Current problems with teeth and/or dentures?: No Does patient usually wear dentures?: No  CIWA:    COWS:     Musculoskeletal: Strength & Muscle Tone: within normal limits Gait &  Station: normal Patient leans: N/A  Psychiatric Specialty Exam: Physical Exam  ROS denies chest pain, no shortness of breath, no vomiting  Blood pressure 119/72, pulse 84, temperature 98.7 F (37.1 C), temperature source Oral, resp. rate 18, height 5\' 9"  (1.753 m), weight 134.7 kg, SpO2 99 %.Body mass index is 43.86 kg/m.  General Appearance: Casual  Eye Contact:  Good  Speech:  Normal Rate  Volume:  Normal  Mood:  Improving mood, states he feels much better today  Affect:  More reactive, smiles at times appropriately, remains vaguely anxious  Thought Process:  Linear, associations intact  Orientation:  Other:  Fully alert and attentive  Thought Content:  No hallucinations, no delusions  Suicidal Thoughts:  No denies suicidal or self-injurious ideations, denies homicidal ideations  Homicidal Thoughts:  No  Memory:  Immediate;   Good Recent;   Good Remote;   Good  Judgement:  Fair-improving  Insight:  Fair  Psychomotor Activity:  Normal  Concentration:  Concentration: Good and Attention Span: Good  Recall:  Good  Fund of Knowledge:  Good  Language:  Good  Akathisia:  No  Handed:  Right  AIMS (if indicated):     Assets:  Communication Skills Desire for Improvement Financial Resources/Insurance Housing Social Support Transportation  ADL's:  Intact  Cognition:  WNL  Sleep:  Number of Hours: 6.75   Assessment-57 year old male admitted for worsening depression, suicidal thoughts, overdose.  History of recent psychiatric admission in August for depression.  Reports history of good response to Celexa and has been restarted on said antidepressant.  He is tolerating it well thus far and states he feels much better today.  Denies suicidal ideations, is currently focusing on being discharged soon in order to return to work  Treatment Plan Summary: Daily contact with patient to assess and evaluate symptoms and progress in treatment, Medication management and Plan is to:  -Treatment  plan reviewed as below today 9/5 Encourage group and milieu participation to work on coping skills and symptom reduction -Continue Celexa 10 mg p.o. daily for depression -Continue Vistaril 25 mg p.o. 3 times daily as needed for anxiety - Continue Trileptal 150 mg p.o. twice daily for mood disorder-patient also reports this medication has significantly helped history of angry outbursts -Continue Trazodone 50 mg p.o. nightly as needed for insomnia Treatment team working on disposition planning options   F Travis Mastel MD

## 2018-04-16 NOTE — Progress Notes (Signed)
D: Patient is visible in the milieu.  His affect is flat; blunted.  His mood appears to be depressed.  He hope to "go home tomorrow and get back to life."  He denies any depressive symptoms or anxiety.  He denies any thoughts of self harm.  Patient is sleeping and eating well.  His energy level is normal and his concentration is good.  A: Continue to monitor medication management and MD orders.  Safety checks completed every 15 minutes per protocol.  Offer support and encouragement as needed.  R: Patient is receptive to staff; his behavior is appropriate.

## 2018-04-16 NOTE — Progress Notes (Signed)
Adult Psychoeducational Group Note  Date:  04/16/2018 Time:  9:18 PM  Group Topic/Focus:  Wrap-Up Group:   The focus of this group is to help patients review their daily goal of treatment and discuss progress on daily workbooks.  Participation Level:  Active  Participation Quality:  Appropriate  Affect:  Appropriate  Cognitive:  Appropriate  Insight: Appropriate  Engagement in Group:  Engaged  Modes of Intervention:  Discussion  Additional Comments:  Pt stated he changed meds yesterday which is a good thing.  Pt stated he slept well.  Pt rated the day at a 10/10.  Aireonna Bauer 04/16/2018, 9:18 PM

## 2018-04-16 NOTE — Plan of Care (Signed)
  Problem: Activity: Goal: Interest or engagement in activities will improve Note:  Attended some groups today.  We will continue to encourage participation in group and unit activities.

## 2018-04-17 MED ORDER — CITALOPRAM HYDROBROMIDE 20 MG PO TABS
20.0000 mg | ORAL_TABLET | Freq: Every day | ORAL | Status: DC
  Administered 2018-04-18: 20 mg via ORAL
  Filled 2018-04-17 (×2): qty 7
  Filled 2018-04-17 (×2): qty 1
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
## 2018-04-17 NOTE — Progress Notes (Signed)
Adult Psychoeducational Group Note  Date:  04/17/2018 Time:  9:02 PM  Group Topic/Focus:  Wrap-Up Group:   The focus of this group is to help patients review their daily goal of treatment and discuss progress on daily workbooks.  Participation Level:  Active  Participation Quality:  Appropriate  Affect:  Appropriate  Cognitive:  Appropriate  Insight: Appropriate  Engagement in Group:  Engaged  Modes of Intervention:  Discussion  Additional Comments: The patient expressed that he rates today a 10 and attend groups.  Octavio Manns 04/17/2018, 9:02 PM

## 2018-04-17 NOTE — Progress Notes (Signed)
  [facility] Adult Case Management Discharge Plan :  Will you be returning to the same living situation after discharge:  Yes,  home At discharge, do you have transportation home?: Yes,  family Do you have the ability to pay for your medications: Yes,  mental health  Release of information consent forms completed and in the chart;  Patient's signature needed at discharge.  Patient to Follow up at: Follow-up Information    [facility] [employer] Go on 04/20/2018.   Why:  Please attend your appt on Monday, 04/20/18, at 8:00am.  Please bring your photo ID, social security card, proof of household income, and your hospital discharge summary. Contact information: [address] [place] [address] [phone]           Next level of care provider has access to [facility] Link:no  Safety Planning and Suicide Prevention discussed: Yes,  yes  Have you used any form of tobacco in the last 30 days? (Cigarettes, Smokeless Tobacco, Cigars, and/or Pipes): No  Has patient been referred to the Quitline?: N/A patient is not a smoker  Patient has been referred for addiction treatment: N/A  Ida Rogue, LCSW 04/17/2018, 3:57 PM

## 2018-04-17 NOTE — Progress Notes (Signed)
Writer spoke with patient 1:1 and he reports looking forward to discharging on tomorrow. He reports that he needs to get back to work and take his son to see the movie IT on tomorrow. He attended group and was compliant worth his medications. Support given and safety maintained on unit with 15 min checks.

## 2018-04-17 NOTE — Progress Notes (Signed)
D: Patient is pleasant with staff; he is interacting well with his peers.  Patient feels he is ready for discharge.  He denies any thoughts of self harm.  He denies any depressive symptoms.  He does not feel anxious today.  His goal today is to "go home."  Patient is a possible discharge today.  A: Continue to monitor medication management and MD orders.  Safety checks completed every 15 minutes per protocol.  Offer support and encouragement as needed.  R: Patient is receptive to staff; his behavior is appropriate.

## 2018-04-17 NOTE — Progress Notes (Signed)
Adult Psychoeducational Group Note  Date:  04/17/2018 Time:  9:42 AM  Group Topic/Focus:  Orientation:   The focus of this group is to educate the patient on the purpose and policies of crisis stabilization and provide a format to answer questions about their admission.  The group details unit policies and expectations of patients while admitted.  Participation Level:  Active  Participation Quality:  Appropriate  Affect:  Appropriate  Cognitive:  Alert  Insight: Appropriate  Engagement in Group:  Engaged  Modes of Intervention:  Discussion and Education  Additional Comments:     Karren Cobble 04/17/2018, 9:42 AM

## 2018-04-18 MED ORDER — HYDROXYZINE HCL 25 MG PO TABS: 25.0000 mg | ORAL_TABLET | Freq: Three times a day (TID) | ORAL | 0 refills | Status: DC | PRN

## 2018-04-18 MED ORDER — TRAZODONE HCL 50 MG PO TABS: 50.0000 mg | ORAL_TABLET | Freq: Every evening | ORAL | 0 refills | Status: DC | PRN

## 2018-04-18 MED ORDER — CITALOPRAM HYDROBROMIDE 20 MG PO TABS: 20.0000 mg | ORAL_TABLET | Freq: Every day | ORAL | 0 refills | Status: AC

## 2018-04-18 MED ORDER — OXCARBAZEPINE 150 MG PO TABS: 150.0000 mg | ORAL_TABLET | Freq: Two times a day (BID) | ORAL | 0 refills | Status: AC

## 2018-04-18 NOTE — Progress Notes (Signed)
D Pt friendly upon approach - appropriate during interactions. Per pt's self inventory, pt rates his depression, hopelessness and anxiety all 0's. Pt reports that he's looking forward to going home today so he can take his son to the movies.  Pt currently denies SI/HI and A/V hallucinations A. Labs and vitals monitored. Pt compliant with medications. Pt supported emotionally and encouraged to express concerns and ask questions.   R. Pt remains safe with 15 minute checks. Will continue POC.
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
## 2018-04-18 NOTE — Discharge Summary (Addendum)
Physician Discharge Summary Note  Patient:  Ricky [name] is an 57 y.o., male MRN:  [id number] DOB:  22-Sep-1960 Patient phone:  [phone] (home)  Patient address:   [address] [place] [address],  Total Time spent with patient: 20 minutes  Date of Admission:  04/14/2018 Date of Discharge: 04/18/18  Reason for Admission:  Worsening depression with Si  Principal Problem: MDD (major depressive disorder), recurrent episode, severe [facility]) Discharge Diagnoses: Patient Active Problem List   Diagnosis Date Noted  . MDD (major depressive disorder), recurrent episode, severe (HCC) [F33.2] 04/14/2018  . Posttraumatic stress disorder [F43.10]   . Impulse control disorder [F63.9]   . Severe recurrent major depression without psychotic features (HCC) [F33.2] 03/23/2018  . Adjustment disorder with depressed mood [F43.21] 03/22/2018    Past Psychiatric History: One previous psych hospitalization in August 2019.  Does not follow a psychiatrist.  Was prescribed Zoloft and felt it did not help.  Reports that Trileptal 150 mg twice daily has really assisted him with staying calm.  He does report that his PCP is the one who prescribes his medications.  He is requesting Celexa as he is used in the past with great benefit.  Past Medical History:  Past Medical History:  Diagnosis Date  . Anxiety   . Depression   . Hypothyroidism   . TBI (traumatic brain injury) [facility])     Past Surgical History:  Procedure Laterality Date  . MOUTH SURGERY     Family History: History reviewed. No pertinent family history. Family Psychiatric  History: Denies Social History:  Social History   Substance and Sexual Activity  Alcohol Use Not Currently     Social History   Substance and Sexual Activity  Drug Use Not Currently    Social History   Socioeconomic History  . Marital status: Divorced    Spouse name: Not on file  . Number of children: 3  . Years of education: Not on file  . Highest  education level: Not on file  Occupational History  . Not on file  Social Needs  . Financial resource strain: Very hard  . Food insecurity:    Worry: Sometimes true    Inability: Sometimes true  . Transportation needs:    Medical: Not on file    Non-medical: Not on file  Tobacco Use  . Smoking status: Never Smoker  . Smokeless tobacco: Never Used  Substance and Sexual Activity  . Alcohol use: Not Currently  . Drug use: Not Currently  . Sexual activity: Not Currently  Lifestyle  . Physical activity:    Days per week: Not on file    Minutes per session: Not on file  . Stress: Rather much  Relationships  . Social connections:    Talks on phone: Not on file    Gets together: Not on file    Attends religious service: Not on file    Active member of club or organization: Not on file    Attends meetings of clubs or organizations: Not on file    Relationship status: Not on file  Other Topics Concern  . Not on file  Social History Narrative  . Not on file    Hospital Course:   04/15/18 [facility] MD Assessment: 44, lives with ex wife, has three children ( 21,30,86), currently with their mother, patient is employed. History of recent psychiatric admission to [facility] from 8/13- 8/16. At the time had presented to depression, suicidal ideations in the context of multiple losses (  divorce, loss of job). At the time was diagnosed with MDD, discharged on Trileptal, Zoloft, Trazodone . Patient states he stopped the Zoloft after he was discharged because " it was not working ".  Patient states he initially felt better, was less depressed, " doing all right", but states that over the last several days he has again felt more depressed and impulsively overdosed on Vistaril- states he took about 25. States after overdose he called 911 and was sent to ED.  Psychiatric history- history of depression, denies prior suicide attempts, one prior psychiatric admission as above. Denies history of psychosis, Does not  endorse any clear history of mania, but reports history of brief episodes of angry outbursts/explosiveness, which he feels have improved since he started Trilpetal. Of note, states that he did very well on Celexa in the past, states he stopped it because he felt better, not due to any side effects.  Reports some neuro-vegetative symptoms- reports anhedonia, decreased sleep, energy, and appetite. Denies psychotic symptoms. Denies alcohol or drug abuse  Medical History- Hypothyroidism. NKDA. Family history- mother alive, father deceased from cancer, has 9 siblings , states nephew has history of depression, substance abuse and committed suicide .  Patient remained on the [facility] unit for 3 days. The patient stabilized on medication and therapy. Patient was discharged on Celexa 20 mg Daily, Trazodone 50 mg QHS PRN, Vistaril 25 mg TID PRN. Patient has shown improvement with improved mood, affect, sleep, appetite, and interaction. Patient has attended group and participated. Patient has been seen in the day room interacting with peers and staff appropriately. Patient denies any SI/HI/AVH and contracts for safety. Patient agrees to follow up at [facility]. Patient is provided with prescriptions for their medications upon discharge.   Physical Findings: AIMS: Facial and Oral Movements Muscles of Facial Expression: None, normal Lips and Perioral Area: None, normal Jaw: None, normal Tongue: None, normal,Extremity Movements Upper (arms, wrists, hands, fingers): None, normal Lower (legs, knees, ankles, toes): None, normal, Trunk Movements Neck, shoulders, hips: None, normal, Overall Severity Severity of abnormal movements (highest score from questions above): None, normal Incapacitation due to abnormal movements: None, normal Patient's awareness of abnormal movements (rate only patient's report): No Awareness, Dental Status Current problems with teeth and/or dentures?: No Does patient usually  wear dentures?: No  CIWA:    COWS:     Musculoskeletal: Strength & Muscle Tone: within normal limits Gait & Station: normal Patient leans: N/A  Psychiatric Specialty Exam: Physical Exam  Nursing note and vitals reviewed. Constitutional: He is oriented to person, place, and time. He appears well-developed and well-nourished.  Respiratory: Effort normal.  Musculoskeletal: Normal range of motion.  Neurological: He is alert and oriented to person, place, and time.  Skin: Skin is warm.    Review of Systems  Constitutional: Negative.   HENT: Negative.   Eyes: Negative.   Respiratory: Negative.   Cardiovascular: Negative.   Gastrointestinal: Negative.   Genitourinary: Negative.   Musculoskeletal: Negative.   Skin: Negative.   Neurological: Negative.   Endo/Heme/Allergies: Negative.   Psychiatric/Behavioral: Negative.     Blood pressure 115/79, pulse (!) 104, temperature 98.1 F (36.7 C), temperature source Oral, resp. rate 20, height 5\' 9"  (1.753 m), weight 134.7 kg, SpO2 99 %.Body mass index is 43.86 kg/m.  General Appearance: Casual  Eye Contact:  Good  Speech:  Clear and Coherent and Normal Rate  Volume:  Normal  Mood:  Euthymic  Affect:  Congruent  Thought Process:  Goal Directed  and Descriptions of Associations: Intact  Orientation:  Full (Time, Place, and Person)  Thought Content:  WDL  Suicidal Thoughts:  No  Homicidal Thoughts:  No  Memory:  Immediate;   Good Recent;   Good Remote;   Good  Judgement:  Fair  Insight:  Fair  Psychomotor Activity:  Normal  Concentration:  Concentration: Good and Attention Span: Good  Recall:  Good  Fund of Knowledge:  Good  Language:  Good  Akathisia:  No  Handed:  Right  AIMS (if indicated):     Assets:  Communication Skills Desire for Improvement Financial Resources/Insurance Housing Physical Health Social Support Transportation  ADL's:  Intact  Cognition:  WNL  Sleep:  Number of Hours: 6.25     Have you used  any form of tobacco in the last 30 days? (Cigarettes, Smokeless Tobacco, Cigars, and/or Pipes): No  Has this patient used any form of tobacco in the last 30 days? (Cigarettes, Smokeless Tobacco, Cigars, and/or Pipes) Yes, No  Blood Alcohol level:  Lab Results  Component Value Date   ETH <10 03/21/2018    Metabolic Disorder Labs:  Lab Results  Component Value Date   HGBA1C 5.4 03/24/2018   MPG 108 03/24/2018   No results found for: PROLACTIN Lab Results  Component Value Date   CHOL 133 03/24/2018   TRIG 68 03/24/2018   HDL 25 (L) 03/24/2018   CHOLHDL 5.3 03/24/2018   VLDL 14 03/24/2018   LDLCALC 94 03/24/2018    See Psychiatric Specialty Exam and Suicide Risk Assessment completed by Attending Physician prior to discharge.  Discharge destination:  Home  Is patient on multiple antipsychotic therapies at discharge:  No   Has Patient had three or more failed trials of antipsychotic monotherapy by history:  No  Recommended Plan for Multiple Antipsychotic Therapies: NA   Allergies as of 04/18/2018   No Known Allergies     Medication List    STOP taking these medications   sertraline 50 MG tablet Commonly known as:  ZOLOFT   testosterone cypionate 200 MG/ML injection Commonly known as:  DEPOTESTOSTERONE CYPIONATE     TAKE these medications     Indication  citalopram 20 MG tablet Commonly known as:  CELEXA Take 1 tablet (20 mg total) by mouth daily. For mood control  Indication:  mood stability   fluticasone 50 MCG/ACT nasal spray Commonly known as:  FLONASE Place 2 sprays into both nostrils 2 (two) times daily.  Indication:  Signs and Symptoms of Nose Diseases   hydrOXYzine 25 MG tablet Commonly known as:  ATARAX/VISTARIL Take 1 tablet (25 mg total) by mouth 3 (three) times daily as needed for anxiety.  Indication:  Feeling Anxious   levothyroxine 50 MCG tablet Commonly known as:  SYNTHROID, LEVOTHROID Take 50 mcg by mouth daily.  Indication:  Underactive  Thyroid   OXcarbazepine 150 MG tablet Commonly known as:  TRILEPTAL Take 1 tablet (150 mg total) by mouth 2 (two) times daily. For anxiety and mood control What changed:  additional instructions  Indication:  anxiety and mood   traZODone 50 MG tablet Commonly known as:  DESYREL Take 1 tablet (50 mg total) by mouth at bedtime as needed for sleep.  Indication:  Trouble Sleeping      Follow-up [employer], [employer] [facility]. Go on 04/20/2018.   Why:  Please attend your appt on Monday, 04/20/18, at 8:00am.  Please bring your photo ID, social security card, proof of household income, and  your hospital discharge summary. Contact information: [address] [place] [address] [phone]           Follow-up recommendations:  Continue activity as tolerated. Continue diet as recommended by your PCP. Ensure to keep all appointments with outpatient providers.  Comments:  Patient is instructed prior to discharge to: Take all medications as prescribed by his/her mental healthcare provider. Report any adverse effects and or reactions from the medicines to his/her outpatient provider promptly. Patient has been instructed & cautioned: To not engage in alcohol and or illegal drug use while on prescription medicines. In the event of worsening symptoms, patient is instructed to call the crisis hotline, 911 and or go to the nearest ED for appropriate evaluation and treatment of symptoms. To follow-up with his/her primary care provider for your other medical issues, concerns and or health care needs.    Signed: Gerlene Burdock Money, FNP 04/18/2018, 8:10 AM  Patient seen, Suicide Assessment Completed.  Disposition Plan Reviewed

## 2018-04-18 NOTE — Progress Notes (Signed)
Pt discharged to lobby with ex wife there for transportation. Pt was stable and appreciative at that time. All papers,samples,and  prescriptions were given and valuables returned. Verbal understanding expressed. Denies SI/HI and A/VH. Pt given opportunity to express concerns and ask questions.

## 2018-04-18 NOTE — [facility] Suicide Risk Assessment (Signed)
[facility] Discharge Suicide Risk Assessment   Principal Problem: MDD (major depressive disorder), recurrent episode, severe (HCC) Discharge Diagnoses:  Patient Active Problem List   Diagnosis Date Noted  . MDD (major depressive disorder), recurrent episode, severe (HCC) [F33.2] 04/14/2018  . Posttraumatic stress disorder [F43.10]   . Impulse control disorder [F63.9]   . Severe recurrent major depression without psychotic features (HCC) [F33.2] 03/23/2018  . Adjustment disorder with depressed mood [F43.21] 03/22/2018    Total Time spent with patient: 30 minutes  Musculoskeletal: Strength & Muscle Tone: within normal limits Gait & Station: normal Patient leans: N/A  Psychiatric Specialty Exam: ROS no headache, no chest pain, no shortness of breath, no vomiting   Blood pressure 115/79, pulse (!) 104, temperature 98.1 F (36.7 C), temperature source Oral, resp. rate 20, height 5\' 9"  (1.753 m), weight 134.7 kg, SpO2 99 %.Body mass index is 43.86 kg/m.  General Appearance: improving grooming   Eye Contact::  Good  Speech:  Normal Rate409  Volume:  Normal  Mood:  denies feeling depressed, states mood is in " good spirits today"  Affect:  Appropriate and reactive   Thought Process:  Linear and Descriptions of Associations: Intact  Orientation:  Other:  fully alert and attentive  Thought Content:  no hallucinations, no delusions, not internally preoccupied   Suicidal Thoughts:  No denies suicidal or self injurious ideations, denies homicidal or violent ideations  Homicidal Thoughts:  No  Memory:  recent and remote grossly intact   Judgement:  Other:  improving   Insight:  improving   Psychomotor Activity:  Normal  Concentration:  Good  Recall:  Good  Fund of Knowledge:Good  Language: Good  Akathisia:  Negative  Handed:  Right  AIMS (if indicated):     Assets:  Financial Resources/Insurance Resilience  Sleep:  Number of Hours: 6.25  Cognition: WNL  ADL's:  Intact   Mental Status  Per Nursing Assessment::   On Admission:  Suicidal ideation indicated by patient, Self-harm thoughts, Self-harm behaviors  Demographic Factors:  56, lives with ex-wife, has three children, employed   Loss Factors: Reports he feels prior antidepressant trial was not working well for him  Historical Factors: History of depression, describes history of intermittent explosiveness much improved on Trileptal, history of prior psychiatric admission, denies prior history of suicide attempts  Risk Reduction Factors:   Sense of responsibility to family, Employed, Living with another person, especially a relative and Positive coping skills or problem solving skills  Continued Clinical Symptoms:  At this time alert, attentive, well related, calm, reports mood as much improved and denies feeling depressed at this time, affect more reactive, no thought disorder, no SI or HI, no psychotic symptoms, future oriented, states plans to take his children to the movies today and is looking forward to returning to work  Monday. Has been interactive, calm on unit, presents pleasant on approach Denies medication side effects- reports history of good response and tolerance to Celexa.  Cognitive Features That Contribute To Risk:  No gross cognitive deficits noted upon discharge. Is alert , attentive, and oriented x 3   Suicide Risk:  Mild:  Suicidal ideation of limited frequency, intensity, duration, and specificity.  There are no identifiable plans, no associated intent, mild dysphoria and related symptoms, good self-control (both objective and subjective assessment), few other risk factors, and identifiable protective factors, including available and accessible social support.  Follow-up Information    [employer] [employer]. Go on 04/20/2018.   Why:  Please  attend your appt on Monday, 04/20/18, at 8:00am.  Please bring your photo ID, social security card, proof of household income, and your hospital  discharge summary. Contact information: [address] [place] [address] [phone]           Plan Of Care/Follow-up recommendations:  Activity:  as tolerated  Diet:  regular Tests:  NA Other:  See below  Patient is expressing readiness for discharge- no current grounds for involuntary commitment- leaving unit in good spirits  Plans to return home Follow up as above  Has an established PCP in [place], [place] for medical issues as needed    Craige Cotta, MD 04/18/2018, 8:29 AM
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
## 2019-04-26 DIAGNOSIS — F341 Dysthymic disorder: Secondary | ICD-10-CM

## 2019-04-26 HISTORY — DX: Dysthymic disorder: F34.1

## 2019-07-14 DIAGNOSIS — F319 Bipolar disorder, unspecified: Secondary | ICD-10-CM | POA: Insufficient documentation

## 2019-07-14 HISTORY — DX: Bipolar disorder, unspecified: F31.9

## 2019-07-19 DIAGNOSIS — E785 Hyperlipidemia, unspecified: Secondary | ICD-10-CM

## 2019-07-19 HISTORY — DX: Hyperlipidemia, unspecified: E78.5

## 2019-08-16 DIAGNOSIS — N401 Enlarged prostate with lower urinary tract symptoms: Secondary | ICD-10-CM

## 2019-08-16 HISTORY — DX: Benign prostatic hyperplasia with lower urinary tract symptoms: N40.1

## 2019-12-06 ENCOUNTER — Ambulatory Visit (INDEPENDENT_AMBULATORY_CARE_PROVIDER_SITE_OTHER): Payer: Self-pay | Admitting: Podiatry

## 2019-12-06 ENCOUNTER — Other Ambulatory Visit: Payer: Self-pay | Admitting: Podiatry

## 2019-12-06 ENCOUNTER — Other Ambulatory Visit: Payer: Self-pay

## 2019-12-06 DIAGNOSIS — R600 Localized edema: Secondary | ICD-10-CM

## 2019-12-06 DIAGNOSIS — M7662 Achilles tendinitis, left leg: Secondary | ICD-10-CM

## 2019-12-06 DIAGNOSIS — M25472 Effusion, left ankle: Secondary | ICD-10-CM

## 2019-12-06 DIAGNOSIS — M79672 Pain in left foot: Secondary | ICD-10-CM

## 2019-12-06 DIAGNOSIS — M25572 Pain in left ankle and joints of left foot: Secondary | ICD-10-CM

## 2019-12-06 DIAGNOSIS — S86019A Strain of unspecified Achilles tendon, initial encounter: Secondary | ICD-10-CM

## 2019-12-06 DIAGNOSIS — S86012A Strain of left Achilles tendon, initial encounter: Secondary | ICD-10-CM

## 2019-12-06 MED ORDER — HYDROCODONE-ACETAMINOPHEN 5-325 MG PO TABS: 1.0000 | ORAL_TABLET | Freq: Four times a day (QID) | ORAL | 0 refills | Status: DC | PRN

## 2019-12-06 NOTE — Progress Notes (Signed)
  Subjective:  Patient ID: Ricky [name], male    DOB: 10/04/1960,  MRN: [id number]  Chief Complaint  Patient presents with  . Injury    Lt achilles tendon rupture x Tues; 10/10s harp apins --pt states he stepped on a limb and foot went back and felt a popl -w/ swellign tx: icing, elevation and prednisone    59 y.o. male presents with the above complaint. History confirmed with patient.   Objective:  Physical Exam: warm, good capillary refill, no trophic changes or ulcerative lesions, normal DP and PT pulses and normal sensory exam. Left Foot: Edema, pain at posterior leg with Achilles deficit with approx 5cm retraction. Minimal DF/PF but with severe pain on attempted ROM  No images are attached to the encounter.  Assessment:   1. Achilles rupture, left, initial encounter   2. Achilles tendon avulsion   3. Pain and swelling of left ankle   4. Achilles tendinitis of left lower extremity   5. Localized edema      Plan:  Patient was evaluated and treated and all questions answered.  Achille Avulsion Injury L -MRI reviewed. Achilles avulsion injury with 5cm retraction -Soft cast applied for reduction of edema -Discussed the importance of strict NWB -Advised to purchase knee scooter for NWB -Patient has failed all conservative therapy and wishes to proceed with surgical intervention. All risks, benefits, and alternatives discussed with patient. No guarantees given. Consent reviewed and signed by patient. -Planned procedures: Repair of achilles avulsion rupture with excision of bone and anchoring of tendon with possible tendon lengthening vs transfer.  No follow-ups on file.

## 2019-12-06 NOTE — Patient Instructions (Signed)
Pre-Operative Instructions  Congratulations, you have decided to take an important step towards improving your quality of life.  You can be assured that the doctors and staff at [facility] will be with you every step of the way.  Here are some important things you should know:  1. Plan to be at the surgery center/hospital at least 1 (one) hour prior to your scheduled time, unless otherwise directed by the surgical center/hospital staff.  You must have a responsible adult accompany you, remain during the surgery and drive you home.  Make sure you have directions to the surgical center/hospital to ensure you arrive on time. 2. If you are having surgery at [facility] or [facility], you will need a copy of your medical history and physical form from your family physician within one month prior to the date of surgery. We will give you a form for your primary physician to complete.  3. We make every effort to accommodate the date you request for surgery.  However, there are times where surgery dates or times have to be moved.  We will contact you as soon as possible if a change in schedule is required.   4. No aspirin/ibuprofen for one week before surgery.  If you are on aspirin, any non-steroidal anti-inflammatory medications (Mobic, Aleve, Ibuprofen) should not be taken seven (7) days prior to your surgery.  You make take Tylenol for pain prior to surgery.  5. Medications - If you are taking daily heart and blood pressure medications, seizure, reflux, allergy, asthma, anxiety, pain or diabetes medications, make sure you notify the surgery center/hospital before the day of surgery so they can tell you which medications you should take or avoid the day of surgery. 6. No food or drink after midnight the night before surgery unless directed otherwise by surgical center/hospital staff. 7. No alcoholic beverages 24-hours prior to surgery.  No smoking 24-hours prior or 24-hours after  surgery. 8. Wear loose pants or shorts. They should be loose enough to fit over bandages, boots, and casts. 9. Don't wear slip-on shoes. Sneakers are preferred. 10. Bring your boot with you to the surgery center/hospital.  Also bring crutches or a walker if your physician has prescribed it for you.  If you do not have this equipment, it will be provided for you after surgery. 11. If you have not been contacted by the surgery center/hospital by the day before your surgery, call to confirm the date and time of your surgery. 12. Leave-time from work may vary depending on the type of surgery you have.  Appropriate arrangements should be made prior to surgery with your employer. 13. Prescriptions will be provided immediately following surgery by your doctor.  Fill these as soon as possible after surgery and take the medication as directed. Pain medications will not be refilled on weekends and must be approved by the doctor. 14. Remove nail polish on the operative foot and avoid getting pedicures prior to surgery. 15. Wash the night before surgery.  The night before surgery wash the foot and leg well with water and the antibacterial soap provided. Be sure to pay special attention to beneath the toenails and in between the toes.  Wash for at least three (3) minutes. Rinse thoroughly with water and dry well with a towel.  Perform this wash unless told not to do so by your physician.  Enclosed: 1 Ice pack (please put in freezer the night before surgery)   1 Hibiclens skin cleaner     Pre-op instructions  If you have any questions regarding the instructions, please do not hesitate to call our office.  [place]: [address], [place], [place] [address] -- [phone]  [place]: [address] [place], [place] [address] -- [phone]  [place]: [address], [place], [place] [address] -- [phone]   Website: [email or web address] 

## 2019-12-14 ENCOUNTER — Other Ambulatory Visit: Payer: Self-pay | Admitting: Podiatry

## 2019-12-14 ENCOUNTER — Encounter: Payer: Self-pay | Admitting: Podiatry

## 2019-12-14 DIAGNOSIS — M216X2 Other acquired deformities of left foot: Secondary | ICD-10-CM

## 2019-12-14 DIAGNOSIS — M7661 Achilles tendinitis, right leg: Secondary | ICD-10-CM

## 2019-12-14 MED ORDER — OXYCODONE-ACETAMINOPHEN 10-325 MG PO TABS: 1.0000 | ORAL_TABLET | ORAL | 0 refills | Status: DC | PRN

## 2019-12-14 MED ORDER — CEPHALEXIN 500 MG PO CAPS: 500.0000 mg | ORAL_CAPSULE | Freq: Three times a day (TID) | ORAL | 0 refills | Status: DC

## 2019-12-14 NOTE — Progress Notes (Signed)
Rx sent to pharmacy for outpatient surgery. °

## 2019-12-21 ENCOUNTER — Ambulatory Visit (INDEPENDENT_AMBULATORY_CARE_PROVIDER_SITE_OTHER): Payer: Self-pay | Admitting: Podiatry

## 2019-12-21 ENCOUNTER — Ambulatory Visit (INDEPENDENT_AMBULATORY_CARE_PROVIDER_SITE_OTHER): Payer: Self-pay

## 2019-12-21 ENCOUNTER — Other Ambulatory Visit: Payer: Self-pay

## 2019-12-21 ENCOUNTER — Other Ambulatory Visit: Payer: Self-pay | Admitting: Podiatry

## 2019-12-21 DIAGNOSIS — Z9889 Other specified postprocedural states: Secondary | ICD-10-CM

## 2019-12-21 DIAGNOSIS — S86012A Strain of left Achilles tendon, initial encounter: Secondary | ICD-10-CM

## 2019-12-21 DIAGNOSIS — S86012D Strain of left Achilles tendon, subsequent encounter: Secondary | ICD-10-CM

## 2019-12-22 ENCOUNTER — Encounter: Payer: Self-pay | Admitting: Podiatry

## 2019-12-28 ENCOUNTER — Other Ambulatory Visit: Payer: Self-pay

## 2019-12-28 ENCOUNTER — Ambulatory Visit (INDEPENDENT_AMBULATORY_CARE_PROVIDER_SITE_OTHER): Payer: Self-pay | Admitting: Podiatry

## 2019-12-28 DIAGNOSIS — Z9889 Other specified postprocedural states: Secondary | ICD-10-CM

## 2019-12-28 DIAGNOSIS — S86012D Strain of left Achilles tendon, subsequent encounter: Secondary | ICD-10-CM

## 2019-12-28 DIAGNOSIS — S86012A Strain of left Achilles tendon, initial encounter: Secondary | ICD-10-CM

## 2019-12-28 NOTE — Progress Notes (Addendum)
  Subjective:  Patient ID: Parley Pidcock, male    DOB: [birth year],  MRN: [id number]  Chief Complaint  Patient presents with  . Cast Removal    POV -F/U for a cast removal -pt states swelling is much better    DOS: 12/14/19 Procedure: Achilles tendon repair left, gastrocenemius recession  59 y.o. male presents with the above complaint. History confirmed with patient. Cast today is dirty and broken on the bottom. States he is walking on his cast "only when I need to" despite strict NWB precautions.    Objective:  Physical Exam: tenderness at the surgical site, local edema noted and calf supple, nontender. Incision: slow healing, no significant drainage, partial dehiscence at watershed area with loosening of sutures no significant erythema   Assessment:   1. Achilles rupture, left, subsequent encounter   2. Post-operative state    Plan:  Patient was evaluated and treated and all questions answered.  Post-operative State -Wound dressed with betadine WTD -Discussed again the importance of maintaining NWB. We discussed if he continues he is at a high risk of reoperation and that repair would be exceedingly difficult. -Short leg cast reapplied with 3 rolls of fiberglass cast material  -Continue NWB with knee scooter -F/u in 1 weeks for wound check.  No follow-ups on file.

## 2019-12-28 NOTE — Progress Notes (Signed)
  Subjective:  Patient ID: Ricky [name], male    DOB: Mar 02, 1961,  MRN: [id number]  Chief Complaint  Patient presents with  . Routine Post Op    PO V#1 Pt. states," been doing pretty good, as the days went on les [place]; 2/10." tx: hydrocodone, icing and elevation -pt dneis V/F/Ch -w/ nausea -dressing wet and dirty   DOS: 12/14/19 Procedure: Achilles tendon repair left, gastrocenemius recession  59 y.o. male presents with the above complaint. History confirmed with patient.   Objective:  Physical Exam: tenderness at the surgical site, local edema noted and calf supple, nontender. Incision: healing well, no significant drainage, no dehiscence, no significant erythema  Assessment:   1. Achilles rupture, left, initial encounter   2. Post-operative state    Plan:  Patient was evaluated and treated and all questions answered.  Post-operative State -Discussed importance of maintaining NWB -Short leg cast applied with 3 rolls of fiberglass cast material  -F/u in 1 week for wound check.  No follow-ups on file.

## 2020-01-04 ENCOUNTER — Ambulatory Visit (INDEPENDENT_AMBULATORY_CARE_PROVIDER_SITE_OTHER): Payer: Self-pay | Admitting: Podiatry

## 2020-01-04 ENCOUNTER — Other Ambulatory Visit: Payer: Self-pay

## 2020-01-04 DIAGNOSIS — Z91199 Patient's noncompliance with other medical treatment and regimen due to unspecified reason: Secondary | ICD-10-CM

## 2020-01-04 DIAGNOSIS — T8130XA Disruption of wound, unspecified, initial encounter: Secondary | ICD-10-CM

## 2020-01-04 DIAGNOSIS — S86012D Strain of left Achilles tendon, subsequent encounter: Secondary | ICD-10-CM

## 2020-01-04 DIAGNOSIS — Z9119 Patient's noncompliance with other medical treatment and regimen: Secondary | ICD-10-CM

## 2020-01-04 DIAGNOSIS — Z9889 Other specified postprocedural states: Secondary | ICD-10-CM

## 2020-01-04 MED ORDER — DOXYCYCLINE HYCLATE 100 MG PO TABS: 100.0000 mg | ORAL_TABLET | Freq: Two times a day (BID) | ORAL | 0 refills | Status: DC

## 2020-01-04 NOTE — Progress Notes (Addendum)
  Subjective:  Patient ID: Ricky [name], male    DOB: [birth year],  MRN: [id number]  Chief Complaint  Patient presents with  . Cast Removal    F/U cast removal Pt. states," itching like crazy." -pt denies pain/numbness/tingling tx: scooter and IBU as needed    DOS: 12/14/19 Procedure: Achilles tendon repair left, gastrocenemius recession  59 y.o. male presents with the above complaint. History confirmed with patient. Cast today is again dirty with a large hole in the bottom. He admits to walking on it despite strict NWB precautions. He states he cannot fully stay off of it due to lack of home support.  Objective:  Physical Exam: tenderness at the surgical site, local edema noted and calf supple, nontender. Incision: slow healing, no significant drainage, partial dehiscence at watershed area with loosening of sutures now with periwound erythema but no fluctuance, crepitus.  Assessment:   1. Achilles rupture, left, subsequent encounter   2. Post-operative state   3. Wound dehiscence   4. Non-compliance    Plan:  Patient was evaluated and treated and all questions answered.  Post-operative State, with dehiscence; non-compliance. -Wound dressed with abx ointment and band-aid -Rx doxycycline for periwound cellulitis -Discussed again the importance of maintaining NWB. He now has a wound dehiscence with infection. He has worn a hole through the bottom of his cast. He is at a high risk of reoperation and that repair would be exceedingly difficult. -Short leg cast reapplied with 5 layers of fiberglass casting material  Return in about 1 week (around 01/11/2020) for Post-Op (No XRs), cast application.

## 2020-01-12 ENCOUNTER — Other Ambulatory Visit: Payer: Self-pay

## 2020-01-12 ENCOUNTER — Ambulatory Visit (INDEPENDENT_AMBULATORY_CARE_PROVIDER_SITE_OTHER): Payer: Self-pay | Admitting: Sports Medicine

## 2020-01-12 DIAGNOSIS — Z91199 Patient's noncompliance with other medical treatment and regimen due to unspecified reason: Secondary | ICD-10-CM

## 2020-01-12 DIAGNOSIS — Z9889 Other specified postprocedural states: Secondary | ICD-10-CM

## 2020-01-12 DIAGNOSIS — T8130XA Disruption of wound, unspecified, initial encounter: Secondary | ICD-10-CM

## 2020-01-12 DIAGNOSIS — S86012D Strain of left Achilles tendon, subsequent encounter: Secondary | ICD-10-CM

## 2020-01-12 DIAGNOSIS — Z9119 Patient's noncompliance with other medical treatment and regimen: Secondary | ICD-10-CM

## 2020-01-12 MED ORDER — DOXYCYCLINE HYCLATE 100 MG PO TABS: 100.0000 mg | ORAL_TABLET | Freq: Two times a day (BID) | ORAL | 0 refills | Status: DC

## 2020-01-12 NOTE — Progress Notes (Signed)
Subjective: Ricky [name] is a 59 y.o. male patient seen today in office for cast change, patient is S/P left achilles tendon repair with EPF performed by Dr. March Rummage on 12/14/2019. Patient denies pain at surgical site reports that the itchiness is getting better, denies calf pain, denies headache, chest pain, shortness of breath, nausea, vomiting, fever, or chills. Patient states that he is taking doxycycline and has a note for about 2 days left.  No other issues noted.  Patient is questioning why he needs to get another cast advised patient it is best for him to stay casted to protect the Achilles area while it heals I also made a phone call to Dr. March Rummage to further discuss his concerns and Dr. March Rummage also agreed to continue with casting the patient and to keep him nonweightbearing with use of his knee scooter.  Patient Active Problem List   Diagnosis Date Noted  . Dysthymia 04/26/2019  . MDD (major depressive disorder), recurrent episode, severe ([place]) 04/14/2018  . Posttraumatic stress disorder   . Impulse control disorder   . Severe recurrent major depression without psychotic features ([place]) 03/23/2018  . Adjustment disorder with depressed mood 03/22/2018  . Chronic pain of left knee 10/30/2017  . Vasomotor rhinitis 09/02/2017  . Acquired hypothyroidism 08/11/2017  . Allergic rhinitis 08/11/2017  . Allergy to antithrombotic medication 08/11/2017  . Chronic fatigue 08/11/2017  . Hypogonadism in male 08/11/2017  . Morbid obesity with BMI of 45.0-49.9, adult ([employer]) 08/11/2017  . Need for hepatitis C screening test 08/11/2017  . 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 Bilateral shoulder pain 07/12/2014  . Primary osteoarthritis of right knee 07/12/2014  . Shoulder impingement syndrome, unspecified laterality 07/12/2014    Current Outpatient Medications on File Prior to Visit  Medication Sig Dispense Refill  . cephALEXin (KEFLEX) 500 MG capsule Take 1 capsule (500 mg total) by mouth 3  (three) times daily. 21 capsule 0  . citalopram (CELEXA) 20 MG tablet Take 1 tablet (20 mg total) by mouth daily. For mood control 30 tablet 0  . fluticasone (FLONASE) 50 MCG/ACT nasal spray Place 2 sprays into both nostrils 2 (two) times daily.    . furosemide (LASIX) 20 MG tablet Take 20 mg by mouth daily.    Marland Kitchen HYDROcodone-acetaminophen (NORCO) 5-325 MG tablet Take 1 tablet by mouth every 6 (six) hours as needed for moderate pain. 12 tablet 0  . hydrOXYzine (ATARAX/VISTARIL) 25 MG tablet Take 1 tablet (25 mg total) by mouth 3 (three) times daily as needed for anxiety. 30 tablet 0  . levothyroxine (SYNTHROID, LEVOTHROID) 50 MCG tablet Take 50 mcg by mouth daily.  99  . methylPREDNISolone (MEDROL DOSEPAK) 4 MG TBPK tablet See admin instructions. see package    . OXcarbazepine (TRILEPTAL) 150 MG tablet Take 1 tablet (150 mg total) by mouth 2 (two) times daily. For anxiety and mood control 60 tablet 0  . oxyCODONE-acetaminophen (PERCOCET) 10-325 MG tablet Take 1 tablet by mouth every 4 (four) hours as needed for pain. 20 tablet 0  . testosterone cypionate (DEPOTESTOSTERONE CYPIONATE) 200 MG/ML injection Inject 400 mg into the muscle every 14 (fourteen) days.    . traZODone (DESYREL) 50 MG tablet Take 1 tablet (50 mg total) by mouth at bedtime as needed for sleep. 30 tablet 0   No current facility-administered medications on file prior to visit.    No Known Allergies  Objective: There were no vitals filed for this visit.  General: No acute distress, AAOx3  Left foot sutures  intact with very minimal gapping noted at the watershed area of the Achilles, decreased erythema, no warmth, no active drainage, no other signs of infection noted, Capillary fill time <3 seconds in all digits, gross sensation present via light touch to left foot.  No pain with calf compression.   Assessment and Plan:  Problem List Items Addressed This Visit    None    Visit Diagnoses    Post-operative state    -  Primary    Achilles rupture, left, subsequent encounter       Wound dehiscence       Non-compliance           -Patient seen and evaluated -Discussed case with Dr. Samuella Cota as above -Applied below the knee fiberglass cast to left foot and lower extremity cast was well-padded to make sure that there is no rubbing or irritation at bony prominences -Advised patient to keep cast clean dry and intact until next office visit and to refrain from walking on the cast today's cast was really dirty with a hole of the bottom of the heel -Advised patient to continue with knee scooter -Advised patient to ice and elevate as necessary  -Continue with doxycycline refill provided during this visit to help with the periwound cellulitis that was present last visit that appears to slowly be decreasing -Will plan for cast change with Dr. Samuella Cota at next office visit. In the meantime, patient to call office if any issues or problems arise.   Asencion Islam, DPM

## 2020-01-17 ENCOUNTER — Ambulatory Visit (INDEPENDENT_AMBULATORY_CARE_PROVIDER_SITE_OTHER): Payer: Self-pay | Admitting: Podiatry

## 2020-01-17 ENCOUNTER — Other Ambulatory Visit: Payer: Self-pay

## 2020-01-17 DIAGNOSIS — S86012D Strain of left Achilles tendon, subsequent encounter: Secondary | ICD-10-CM

## 2020-01-17 NOTE — Progress Notes (Signed)
°  Subjective:  Patient ID: Ricky [name], male    DOB: [birth year],  MRN: [id number]  Chief Complaint  Patient presents with   Cast Removal    F/U LT cast removal -pt states," feeling pretty good." -pt denies pain/issues    DOS: 12/14/19 Procedure: Achilles tendon repair left, gastrocenemius recession  59 y.o. male presents with the above complaint. History confirmed with patient. Cast today is worn but without hole in the bottom as previous. States his knee scooter broke and he came in today walking on the cast without assistive device.  Objective:  Physical Exam: tenderness at the surgical site, local edema noted and calf supple, nontender. Incision: slow healing, no significant drainage, partial dehiscence at watershed area without active erythema.  Assessment:   1. Achilles rupture, left, subsequent encounter    Plan:  Patient was evaluated and treated and all questions answered.  Post-operative State, with dehiscence; non-compliance. -Cellulitis resolved. -Staples partially removed. Dressed with WTD and Soft cast of unna boot cast padding and coban. -CAM boot dispensed. -Refer to PT for PROM exercises for 2 weeks, then AROM. Wound care with WTD 2x-3x weekly. Strict NWB.  -He continues to be non-compliant with NWB restrictions.  No follow-ups on file.

## 2020-01-18 ENCOUNTER — Encounter: Payer: Self-pay | Admitting: Podiatry

## 2020-01-25 ENCOUNTER — Other Ambulatory Visit: Payer: Self-pay

## 2020-01-25 ENCOUNTER — Encounter: Payer: Self-pay | Admitting: Podiatry

## 2020-01-25 ENCOUNTER — Ambulatory Visit (INDEPENDENT_AMBULATORY_CARE_PROVIDER_SITE_OTHER): Payer: Self-pay | Admitting: Podiatry

## 2020-01-25 DIAGNOSIS — Z9889 Other specified postprocedural states: Secondary | ICD-10-CM

## 2020-01-25 DIAGNOSIS — S86012D Strain of left Achilles tendon, subsequent encounter: Secondary | ICD-10-CM

## 2020-01-25 MED ORDER — CLINDAMYCIN HCL 300 MG PO CAPS: 300.0000 mg | ORAL_CAPSULE | Freq: Three times a day (TID) | ORAL | 0 refills | Status: DC

## 2020-01-25 MED ORDER — SILVER SULFADIAZINE 1 % EX CREA: TOPICAL_CREAM | CUTANEOUS | 0 refills | Status: DC

## 2020-01-25 NOTE — Progress Notes (Signed)
  Subjective:  Patient ID: Ricky [name], male    DOB: 1961-05-20,  MRN: [id number]  Chief Complaint  Patient presents with  . Post-op Problem    wound pov- pt states he feels pretty good, denies any pain/issues. no issues with staples that are still in   DOS: 12/14/19 Procedure: Achilles tendon repair left, gastrocenemius recession  59 y.o. male presents with the above complaint. History confirmed with patient. Presents without his boot today. Presented to PT but was sent here out of concern for cellulitis of the wound..  Objective:  Physical Exam: no tenderness at the surgical site, local edema noted and calf supple, nontender. Good ROM. No achilles rupture/dell. Incision: No further dehiscence but with fibrotic areas of wound with some areas of periwound erythema.  Assessment:   1. Achilles rupture, left, subsequent encounter   2. Post-operative state    Plan:  Patient was evaluated and treated and all questions answered.  Post-operative State, with dehiscence; non-compliance. -Cellulitis mostly resolved but with some areas of redness.. -Remaning staples removed. -Discussed importance of wearing CAM boot. Not wearing it today. -Refer to PT for PROM exercises for 2 weeks, then AROM.  Strict NWB.  -He continues to be non-compliant with NWB restrictions. Not wearing boot. -Rx sent to the pharmacy for clindamycin  Return in about 2 weeks (around 02/08/2020) for Post-Op (No XRs).

## 2020-01-31 ENCOUNTER — Encounter: Payer: Self-pay | Admitting: Podiatry

## 2020-02-07 ENCOUNTER — Ambulatory Visit (INDEPENDENT_AMBULATORY_CARE_PROVIDER_SITE_OTHER): Payer: Self-pay | Admitting: Podiatry

## 2020-02-07 ENCOUNTER — Other Ambulatory Visit: Payer: Self-pay

## 2020-02-07 DIAGNOSIS — Z9889 Other specified postprocedural states: Secondary | ICD-10-CM

## 2020-02-07 DIAGNOSIS — Z9119 Patient's noncompliance with other medical treatment and regimen: Secondary | ICD-10-CM

## 2020-02-07 DIAGNOSIS — T8130XA Disruption of wound, unspecified, initial encounter: Secondary | ICD-10-CM

## 2020-02-07 DIAGNOSIS — S86012D Strain of left Achilles tendon, subsequent encounter: Secondary | ICD-10-CM

## 2020-02-07 DIAGNOSIS — Z91199 Patient's noncompliance with other medical treatment and regimen due to unspecified reason: Secondary | ICD-10-CM

## 2020-02-07 MED ORDER — CEPHALEXIN 500 MG PO CAPS: 500.0000 mg | ORAL_CAPSULE | Freq: Three times a day (TID) | ORAL | 0 refills | Status: DC

## 2020-02-07 NOTE — Progress Notes (Signed)
  Subjective:  Patient ID: Ricky [name], male    DOB: 18-Oct-1960,  MRN: [id number]  No chief complaint on file.  DOS: 12/14/19 Procedure: Achilles tendon repair left, gastrocenemius recession  59 y.o. male presents with the above complaint. History confirmed with patient. Again without his boot today. Thinks the leg is doing better but with a little drainage. Deneis reness and swelling, N/V/F/Ch. Has been doing epsom salt soaks and apply ing abx ointment.  Objective:  Physical Exam: no tenderness at the surgical site, local edema noted and calf supple, nontender. Good ROM. No achilles rupture/dell. Incision: No further dehiscence but with fibrotic areas of wound with some areas of periwound erythema.  Assessment:   1. Achilles rupture, left, subsequent encounter   2. Post-operative state   3. Wound dehiscence   4. Non-compliance    Plan:  Patient was evaluated and treated and all questions answered.  Post-operative State, with dehiscence; non-compliance. -Still with small abscessed area but no signs of severe acute infection.  -Abscess area at medial ankle anesthetized and curettaged. Macerated keratin cells removed, no purulence noted. No culture performed as this would likely only show normal flora. -He continues to be non-compliant. He is wearing his normal shoe today. He is to continue in his boot until healed. -He did have visibile deep fiberwire suture today. I explained the significance of this and discussed the risk of continued infection. -Will refill abx x1 week. Change to keflex. -Should issues persist he may need wound debridement and repair of dehiscence in the OR. Will check next week.  Return in about 1 week (around 02/14/2020).

## 2020-02-15 ENCOUNTER — Other Ambulatory Visit: Payer: Self-pay

## 2020-02-15 ENCOUNTER — Ambulatory Visit (INDEPENDENT_AMBULATORY_CARE_PROVIDER_SITE_OTHER): Payer: Self-pay | Admitting: Podiatry

## 2020-02-15 DIAGNOSIS — S86012D Strain of left Achilles tendon, subsequent encounter: Secondary | ICD-10-CM

## 2020-02-15 DIAGNOSIS — Z9889 Other specified postprocedural states: Secondary | ICD-10-CM

## 2020-02-15 NOTE — Progress Notes (Signed)
  Subjective:  Patient ID: Ricky [name], male    DOB: Jan 07, 1961,  MRN: [id number]  No chief complaint on file.  DOS: 12/14/19 Procedure: Achilles tendon repair left, gastrocenemius recession  59 y.o. male presents with the above complaint.  States that is better than it was last time he was here.  Denies swelling states it is not very sore or tender.  Objective:  Physical Exam: no tenderness at the surgical site, local edema noted and calf supple, nontender. Good ROM. No achilles rupture/dell. Incision: No further dehiscence with epithelialized wound and chronic abscess-like cavity lateral to the tendon without expressible purulence Assessment:   1. Achilles rupture, left, subsequent encounter   2. Post-operative state    Plan:  Patient was evaluated and treated and all questions answered.  Post-operative State, with dehiscence; non-compliance. -Overall the wound looks a lot better this week.  There is no visible tendon or suture today.  There does continue to be a deep cavity lateral to the tendon without any expressible purulence.  This may represent chronic inflammation we will monitor.  Advised to continue soaking and applying Silvadene cream and bandage.  No refill of antibiotics indicated today follow-up in 3 weeks for recheck advised to call sooner for appointment should they worsen.  He has good Achilles strength and function he can return to normal shoe gear at this time however he has already been using normal shoe gear despite my express restrictions.  No follow-ups on file.

## 2020-02-29 ENCOUNTER — Other Ambulatory Visit: Payer: Self-pay | Admitting: Podiatry

## 2020-02-29 DIAGNOSIS — Z9889 Other specified postprocedural states: Secondary | ICD-10-CM

## 2020-02-29 DIAGNOSIS — S86012D Strain of left Achilles tendon, subsequent encounter: Secondary | ICD-10-CM

## 2020-03-07 ENCOUNTER — Ambulatory Visit (INDEPENDENT_AMBULATORY_CARE_PROVIDER_SITE_OTHER): Payer: Self-pay | Admitting: Podiatry

## 2020-03-07 DIAGNOSIS — Z5329 Procedure and treatment not carried out because of patient's decision for other reasons: Secondary | ICD-10-CM

## 2020-03-07 NOTE — Progress Notes (Signed)
No show for appt. 

## 2020-03-15 DIAGNOSIS — M79676 Pain in unspecified toe(s): Secondary | ICD-10-CM

## 2020-04-11 ENCOUNTER — Telehealth: Payer: Self-pay | Admitting: Nurse Practitioner

## 2020-04-11 NOTE — Telephone Encounter (Signed)
Called to discuss with [name] about Covid symptoms and the use of casirivimab/imdevimab, a combination monoclonal antibody infusion for those with mild to moderate Covid symptoms and at a high risk of hospitalization.     Pt is qualified for this infusion at the [facility] infusion center due to co-morbid conditions (as indicated below)   Patient verbalized understanding of infusion and appointment details. Scheduled for 04/12/20 @ 0930.     Patient Active Problem List   Diagnosis Date Noted  . Dysthymia 04/26/2019  . MDD (major depressive disorder), recurrent episode, severe ([facility]) 04/14/2018  . Posttraumatic stress disorder   . Impulse control disorder   . Severe recurrent major depression without psychotic features ([facility]) 03/23/2018  . Adjustment disorder with depressed mood 03/22/2018  . Chronic pain of left knee 10/30/2017  . Vasomotor rhinitis 09/02/2017  . Acquired hypothyroidism 08/11/2017  . Allergic rhinitis 08/11/2017  . Allergy to antithrombotic medication 08/11/2017  . Chronic fatigue 08/11/2017  . Hypogonadism in male 08/11/2017  . Morbid obesity with BMI of 45.0-49.9, adult ([facility]) 08/11/2017  . Need for hepatitis C screening test 08/11/2017  . 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 Morbid (severe) obesity due to excess calories ([facility]) 08/11/2017  . Bilateral shoulder pain 07/12/2014  . Primary osteoarthritis of right knee 07/12/2014  . Shoulder impingement syndrome, unspecified laterality 07/12/2014    Ricky [name], AGPCNP-BC

## 2020-04-12 ENCOUNTER — Ambulatory Visit (HOSPITAL_COMMUNITY)
Admission: RE | Admit: 2020-04-12 | Discharge: 2020-04-12 | Disposition: A | Discharge status: Home / Self Care | Payer: HRSA Program | Source: Ambulatory Visit | Attending: Pulmonary Disease | Admitting: Pulmonary Disease

## 2020-04-12 VITALS — BP 131/73 | HR 89 | Temp 99.3°F | Resp 20

## 2020-04-12 DIAGNOSIS — U071 COVID-19: Secondary | ICD-10-CM | POA: Insufficient documentation

## 2020-04-12 MED ORDER — ACETAMINOPHEN 325 MG PO TABS
650.0000 mg | ORAL_TABLET | Freq: Once | ORAL | Status: AC
  Administered 2020-04-12: 650 mg via ORAL
  Filled 2020-04-12: qty 2

## 2020-04-12 MED ORDER — SODIUM CHLORIDE 0.9 % IV SOLN: INTRAVENOUS | Status: DC | PRN

## 2020-04-12 MED ORDER — ALBUTEROL SULFATE HFA 108 (90 BASE) MCG/ACT IN AERS: 2.0000 | INHALATION_SPRAY | Freq: Once | RESPIRATORY_TRACT | Status: DC | PRN

## 2020-04-12 MED ORDER — EPINEPHRINE 0.3 MG/0.3ML IJ SOAJ: 0.3000 mg | Freq: Once | INTRAMUSCULAR | Status: DC | PRN

## 2020-04-12 MED ORDER — METHYLPREDNISOLONE SODIUM SUCC 125 MG IJ SOLR: 125.0000 mg | Freq: Once | INTRAMUSCULAR | Status: DC | PRN

## 2020-04-12 MED ORDER — SODIUM CHLORIDE 0.9 % IV SOLN
1200.0000 mg | Freq: Once | INTRAVENOUS | Status: AC
  Administered 2020-04-12: 1200 mg via INTRAVENOUS

## 2020-04-12 MED ORDER — DIPHENHYDRAMINE HCL 50 MG/ML IJ SOLN: 50.0000 mg | Freq: Once | INTRAMUSCULAR | Status: DC | PRN

## 2020-04-12 MED ORDER — FAMOTIDINE IN NACL 20-0.9 MG/50ML-% IV SOLN: 20.0000 mg | Freq: Once | INTRAVENOUS | Status: DC | PRN

## 2020-04-12 NOTE — Discharge Instructions (Signed)

## 2020-04-12 NOTE — Progress Notes (Signed)
  Diagnosis: COVID-19  Physician: dr Shan Levans  Procedure: [facility] Infusion [facility] Med: casirivimab\imdevimab infusion - Provided patient with casirivimab\imdevimab fact sheet for patients, parents and caregivers prior to infusion.  Complications: No immediate complications noted.  Discharge: Discharged home   [name] 04/12/2020

## 2020-04-12 NOTE — Progress Notes (Signed)
I connected by phone with Ricky [name] on 04/12/2020 at 9:23 AM to discuss the potential use of a new treatment for mild to moderate COVID-19 viral infection in non-hospitalized patients.  This patient is a 59 y.o. male that meets the FDA criteria for Emergency Use Authorization of COVID monoclonal antibody casirivimab/imdevimab.  Has a (+) direct SARS-CoV-2 viral test result  Has mild or moderate COVID-19   Is NOT hospitalized due to COVID-19  Is within 10 days of symptom onset  Has at least one of the high risk factor(s) for progression to severe COVID-19 and/or hospitalization as defined in EUA.  Specific high risk criteria : BMI > 25   I have spoken and communicated the following to the patient or parent/caregiver regarding COVID monoclonal antibody treatment:  1. FDA has authorized the emergency use for the treatment of mild to moderate COVID-19 in adults and pediatric patients with positive results of direct SARS-CoV-2 viral testing who are 110 years of age and older weighing at least 40 kg, and who are at high risk for progressing to severe COVID-19 and/or hospitalization.  2. The significant known and potential risks and benefits of COVID monoclonal antibody, and the extent to which such potential risks and benefits are unknown.  3. Information on available alternative treatments and the risks and benefits of those alternatives, including clinical trials.  4. Patients treated with COVID monoclonal antibody should continue to self-isolate and use infection control measures (e.g., wear mask, isolate, social distance, avoid sharing personal items, clean and disinfect high touch surfaces, and frequent handwashing) according to CDC guidelines.   5. The patient or parent/caregiver has the option to accept or refuse COVID monoclonal antibody treatment.  After reviewing this information with the patient, The patient agreed to proceed with receiving casirivimab\imdevimab infusion and will be  provided a copy of the Fact sheet prior to receiving the infusion. Noreene Filbert 04/12/2020 9:23 AM

## 2020-04-13 DIAGNOSIS — M79676 Pain in unspecified toe(s): Secondary | ICD-10-CM

## 2021-01-05 DIAGNOSIS — I10 Essential (primary) hypertension: Secondary | ICD-10-CM

## 2021-01-05 HISTORY — DX: Essential (primary) hypertension: I10

## 2023-08-19 DIAGNOSIS — F419 Anxiety disorder, unspecified: Secondary | ICD-10-CM | POA: Insufficient documentation

## 2023-08-19 DIAGNOSIS — S069XAA Unspecified intracranial injury with loss of consciousness status unknown, initial encounter: Secondary | ICD-10-CM | POA: Insufficient documentation

## 2023-08-21 ENCOUNTER — Ambulatory Visit: Payer: Medicaid Other

## 2023-08-21 VITALS — BP 112/68 | HR 119 | Ht 72.0 in | Wt 345.8 lb

## 2023-08-21 DIAGNOSIS — I10 Essential (primary) hypertension: Secondary | ICD-10-CM | POA: Diagnosis not present

## 2023-08-21 DIAGNOSIS — I4819 Other persistent atrial fibrillation: Secondary | ICD-10-CM

## 2023-08-21 DIAGNOSIS — F419 Anxiety disorder, unspecified: Secondary | ICD-10-CM

## 2023-08-21 HISTORY — DX: Other persistent atrial fibrillation: I48.19

## 2023-08-21 MED ORDER — APIXABAN 5 MG PO TABS: 5.0000 mg | ORAL_TABLET | Freq: Two times a day (BID) | ORAL | 2 refills | Status: DC

## 2023-08-21 MED ORDER — METOPROLOL TARTRATE 25 MG PO TABS: 25.0000 mg | ORAL_TABLET | Freq: Two times a day (BID) | ORAL | 3 refills | Status: DC

## 2023-08-21 NOTE — Progress Notes (Signed)
 Cardiology Consultation:    Date:  08/21/2023   ID:  Ricky [name], DOB 03-14-1961, MRN [id number]  PCP:  Delores Perkins, [name]  Cardiologist:  Alean SAUNDERS Justice Milliron, MD   Referring MD: No ref. provider found   No chief complaint on file.    ASSESSMENT AND PLAN:   Ricky [name] 63 year old male patient with morbid obesity, prediabetic [hemoglobin A1c 5.7 on 08-07-2023] hypertension, hypothyroidism, osteoarthritis of the knees, nonobstructive right renal stone, mild colonic diverticulosis, reportedly diagnosed with atrial fibrillation 2 to 3 years ago at an urgent care facility and has had no follow-ups with cardiology. He was referred for cardiology evaluation after recent ER visit to [facility] for symptoms from diarrhea on August 07, 2023.  In the ER his heart rate was noted to be well controlled and now referred here for further evaluation.  Problem List Items Addressed This Visit     Essential (primary) hypertension   Good control. Target below 130 over 80 mmHg.  Continue hydrochlorothiazide 25 once daily.       Relevant Medications   hydrochlorothiazide (HYDRODIURIL) 25 MG tablet   apixaban  (ELIQUIS ) 5 MG TABS tablet   metoprolol  tartrate (LOPRESSOR ) 25 MG tablet   Persistent atrial fibrillation (HCC) - Primary   CHA2DS2-VASc score 1 Reportedly diagnosed over 2 to 3 years ago in urgent care facility and has had no further follow-up since.  Remains in atrial relation, rates mildly elevated today in the clinic. Relatively asymptomatic.  Discussed the diagnosis at length. Discussed various triggers. Unknown for how long he has been in it and likely will be difficult for rhythm control without any added antiarrhythmics.  Will proceed with transthoracic echocardiogram to assess cardiac structure and unction.  If no significant abnormalities, we will discuss options for rhythm control with antiarrhythmics.  Nonpharmacological methods to improve rhythm control  success with weight loss, regular exercise discussed.  Recommended further evaluation with PCP with regards to sleep apnea evaluation.  Stroke risk discussed. Role for anticoagulation for lowering stroke risk discussed. He is agreeable to proceed with anticoagulation. Will prescribe Eliquis  5 mg twice daily.  As of today the heart rates are poorly controlled in the ER based on EKG and exam. He has minimal symptoms. Will start metoprolol  tartrate 25 mg twice daily.  Will have him return for follow-up visit in 3 to 4 weeks tentatively with an echocardiogram done prior to that.  He is aware if he has any worsening symptoms of fatigue tiredness shortness of breath, palpitations or lightheadedness or passing out episodes, should head to the ER      Relevant Medications   hydrochlorothiazide (HYDRODIURIL) 25 MG tablet   apixaban  (ELIQUIS ) 5 MG TABS tablet   metoprolol  tartrate (LOPRESSOR ) 25 MG tablet   Other Relevant Orders   EKG 12-Lead (Completed)   ECHOCARDIOGRAM COMPLETE      History of Present Illness:    Ricky [name] is a 63 y.o. male who is being seen today for establishing care with cardiology for further evaluation and management of atrial fibrillation after being noted to be in A-fib rate controlled per EKG during his visit to the [facility] on December 26 for an episode of diarrhea.  Pleasant gentleman Here for the visit by himself.  Lives by himself at home.  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 as he felt he is unable to keep up with his job due to tiredness  Has history of chronic  atrial fibrillation [reportedly diagnosed about 2-3 years ago at an urgent care facility and had no further follow-ups] , prediabetes, hypothyroidism, hypertension, depression, osteoarthritis of both knees, morbid obesity, nonobstructive right renal stone [less than 1 mm in size on CT abdomen 08/07/2023], mild colonic diverticulosis,  He is  in the clinic today shows atrial fibrillation ventricular rate elevated 119 bpm, QRS morphology consistent with incomplete right bundle branch block 110 ms duration, left anterior fascicular block.  In comparison prior EKG available from [facility] dated 08/07/2023 reported atrial fibrillation with heart rate controlled 72/min.  Mentions overall he is able to manage his day-to-day activities.  Over the past few months he has noticed that he gets easily tired and unable to keep up with his work-related activities where he wa driving a dump truck.  Retirement end of December.  He is altered his diet and has lost 4 to 5 pounds in the last 2 to 3 weeks.  Denies any chest pain but at times reports sensation of palpitations where he feels his heart is racing.  Currently even with heart rates at 110s in the clinic he does not feel any symptoms.  He reports instances of lightheadedness on sitting position from bending down to standing up.  He had 1 instance of passing out briefly after such positional change about a year ago while picking up firewood, quickly recovered consciousness and had no residual effects and no recurrence of symptoms.  Good with taking his medications consistently. Has been taking his blood pressure medication hydrochlorothiazide for over 2 years without any problems. Consistently takes his levothyroxine . Also takes aspirin 81 mg once daily.  Denies any blood in urine or stools.  Does not smoke does not drink alcohol does not do any recreational drug use.  Last lab work available to review is from 08/07/2023 at [facility] that showed hemoglobin 14.3, hematocrit 43.2, WBC 8.4, platelets 208 Sodium 134, potassium 4.2, BUN 32, creatinine 0.8, EGFR 101 Normal transaminases and alkaline phosphatase Troponin I less than 0.01 TSH normal 1.55, normal Hemoglobin A1c 5.7, prediabetic Past Medical History:  Diagnosis Date   Acquired hypothyroidism 08/11/2017    Adjustment disorder with depressed mood 03/22/2018   Allergic rhinitis 08/11/2017   Allergy to antithrombotic medication 08/11/2017   Anxiety    Benign prostatic hyperplasia with lower urinary tract symptoms 08/16/2019   Bilateral shoulder pain 07/12/2014   Bipolar disorder, unspecified (HCC) 07/14/2019   Chronic fatigue 08/11/2017   Chronic pain of left knee 10/30/2017   Last Assessment & Plan:   Advised Aleve BID--2 at a time     Depression    Dysthymia 04/26/2019   Formatting of this note might be different from the original.  Trileptal  and Celexa  and doing OK overall     Last Assessment & Plan:   Formatting of this note might be different from the original.  Sees [facility]     Essential (primary) hypertension 01/05/2021   Hyperlipidemia, unspecified 07/19/2019   Hypogonadism in male 08/11/2017   Last Assessment & Plan:   Cont testosterone  200 every 14d     Hypothyroidism    Impulse control disorder    MDD (major depressive disorder), recurrent episode, severe (HCC) 04/14/2018   Morbid (severe) obesity due to excess calories (HCC) 08/11/2017   Formatting of this note might be different from the original.  Last Assessment & Plan:   Formatting of this note might be different from the original.  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   hydrochlorothiazide (HYDRODIURIL) 25 MG tablet Take 25 mg by mouth daily.   metoprolol  tartrate (LOPRESSOR ) 25 MG tablet Take 1 tablet (25 mg total) by mouth 2 (two) times daily.     Allergies:   Patient has no known allergies.   Social History   Socioeconomic History   Marital status: Divorced    Spouse name: Not on file   Number of children: 3   Years of education: Not on file   Highest education level: Not on file  Occupational History   Not on file  Tobacco Use   Smoking status: Never   Smokeless tobacco: Never  Substance and Sexual Activity   Alcohol use: Not Currently   Drug use: Not Currently   Sexual activity: Not Currently  Other Topics Concern   Not on file  Social History Narrative   Not on file   Social Drivers of Health   Financial Resource Strain: Not on File (11/15/2021)   Received from [employer]    Financial Resource Strain: 0  Food Insecurity: Not on File (11/15/2021)   Received from [facility] Insecurity    Food: 0  Transportation Needs: Not on File (11/15/2021)   Received from [employer] Needs    Transportation: 0  Physical Activity: Not on File (11/13/2021)   Received from [facility]   Physical Activity    Physical Activity: 0  Stress: Not on File (11/15/2021)   Received from [facility]   Stress    Stress: 0  Social Connections: Not on File (11/15/2021)   Received from [facility]   Social Connections    Connectedness: 0     Family History: The patient's family history is not on file. ROS:   Please see the history of present illness.    All 14 point review of systems negative except as described per history of present illness.  EKGs/Labs/Other Studies Reviewed:    The following studies were reviewed today:   EKG:  EKG Interpretation Date/Time:  Thursday August 21 2023 15:09:21 EST Ventricular Rate:   119 PR Interval:    QRS Duration:  110 QT Interval:  356 QTC Calculation: 500 R Axis:   -70  Text Interpretation: Atrial fibrillation with rapid ventricular response Low voltage QRS Right bundle branch block Left anterior fascicular block Bifascicular block Abnormal ECG When compared with ECG of 15-Apr-2018 11:00, Atrial fibrillation has replaced Sinus rhythm Vent. rate has increased BY  40 BPM (RBBB and left anterior fascicular block) is now Present Confirmed by Liborio Hai reddy [phone]) on 08/21/2023 3:59:32 PM    Recent Labs: No results found for requested labs within last 365 days.  Recent Lipid Panel    Component Value Date/Time   CHOL 133 03/24/2018 0625   TRIG 68 03/24/2018 0625   HDL 25 (L) 03/24/2018 0625   CHOLHDL 5.3 03/24/2018 0625   VLDL 14 03/24/2018 0625   LDLCALC 94 03/24/2018 0625    Physical Exam:    VS:  BP 112/68   Pulse (!) 119   Ht 6' (1.829 m)   Wt (!) 345 lb 12.8 oz (156.9 kg)   SpO2 95%   BMI 46.90 kg/m     Wt Readings from Last 3 Encounters:  08/21/23 (!) 345 lb 12.8 oz (156.9 kg)     GENERAL:  Well nourished, well developed in no acute distress NECK: No JVD; No carotid bruits CARDIAC: Irregularly irregular, S1 and S2 present, no murmurs, no rubs, no gallops CHEST:  Clear to auscultation without rales, wheezing or rhonchi  Extremities: No pitting pedal edema. Pulses bilaterally symmetric with radial 2+ and dorsalis pedis 2+ NEUROLOGIC:  Alert and oriented x 3  Medication Adjustments/Labs and Tests Ordered: Current medicines are reviewed at length with the patient today.  Concerns regarding medicines are outlined above.  Orders Placed This Encounter  Procedures   EKG 12-Lead   ECHOCARDIOGRAM COMPLETE   Meds ordered this encounter  Medications   apixaban  (ELIQUIS ) 5 MG TABS tablet    Sig: Take 1 tablet (5 mg total) by mouth 2 (two) times daily.    Dispense:  180 tablet    Refill:  2   metoprolol  tartrate (LOPRESSOR ) 25 MG tablet     Sig: Take 1 tablet (25 mg total) by mouth 2 (two) times daily.    Dispense:  180 tablet    Refill:  3    Signed, Mikhai Bienvenue reddy Darlinda Bellows, MD, MPH, [facility]. 08/21/2023 4:32 PM    [facility]

## 2023-08-21 NOTE — Patient Instructions (Signed)
 Medication Instructions:  Your physician has recommended you make the following change in your medication:   Start Eliquis  5 mg twice a day. Once you start this medication, stop taking Aspirin.   Start taking Metoprolol  25 mg twice a day.   *If you need a refill on your cardiac medications before your next appointment, please call your pharmacy*   Lab Work: None Ordered If you have labs (blood work) drawn today and your tests are completely normal, you will receive your results only by: MyChart Message (if you have MyChart) OR A paper copy in the mail If you have any lab test that is abnormal or we need to change your treatment, we will call you to review the results.   Testing/Procedures: Echocardiogram An echocardiogram is a test that uses sound waves (ultrasound) to produce images of the heart. Images from an echocardiogram can provide important information about: Heart size and shape. The size and thickness and movement of your heart's walls. Heart muscle function and strength. Heart valve function or if you have stenosis. Stenosis is when the heart valves are too narrow. If blood is flowing backward through the heart valves (regurgitation). A tumor or infectious growth around the heart valves. Areas of heart muscle that are not working well because of poor blood flow or injury from a heart attack. Aneurysm detection. An aneurysm is a weak or damaged part of an artery Zilberman. The Kranz bulges out from the normal force of blood pumping through the body. Tell a health care provider about: Any allergies you have. All medicines you are taking, including vitamins, herbs, eye drops, creams, and over-the-counter medicines. Any blood disorders you have. Any surgeries you have had. Any medical conditions you have. Whether you are pregnant or may be pregnant. What are the risks? Generally, this is a safe test. However, problems may occur, including an allergic reaction to dye (contrast)  that may be used during the test. What happens before the test? No specific preparation is needed. You may eat and drink normally. What happens during the test?  You will take off your clothes from the waist up and put on a hospital gown. Electrodes or electrocardiogram (ECG)patches may be placed on your chest. The electrodes or patches are then connected to a device that monitors your heart rate and rhythm. You will lie down on a table for an ultrasound exam. A gel will be applied to your chest to help sound waves pass through your skin. A handheld device, called a transducer, will be pressed against your chest and moved over your heart. The transducer produces sound waves that travel to your heart and bounce back (or echo back) to the transducer. These sound waves will be captured in real-time and changed into images of your heart that can be viewed on a video monitor. The images will be recorded on a computer and reviewed by your health care provider. You may be asked to change positions or hold your breath for a short time. This makes it easier to get different views or better views of your heart. In some cases, you may receive contrast through an IV in one of your veins. This can improve the quality of the pictures from your heart. The procedure may vary among health care providers and hospitals. What can I expect after the test? You may return to your normal, everyday life, including diet, activities, and medicines, unless your health care provider tells you not to do that. Follow these instructions at home:  It is up to you to get the results of your test. Ask your health care provider, or the department that is doing the test, when your results will be ready. Keep all follow-up visits. This is important. Summary An echocardiogram is a test that uses sound waves (ultrasound) to produce images of the heart. Images from an echocardiogram can provide important information about the size and  shape of your heart, heart muscle function, heart valve function, and other possible heart problems. You do not need to do anything to prepare before this test. You may eat and drink normally. After the echocardiogram is completed, you may return to your normal, everyday life, unless your health care provider tells you not to do that. This information is not intended to replace advice given to you by your health care provider. Make sure you discuss any questions you have with your health care provider. Document Revised: 04/11/2021 Document Reviewed: 03/21/2020 Elsevier Patient Education  2023 Elsevier Inc.        Follow-Up: At [facility], you and your health needs are our priority.  As part of our continuing mission to provide you with exceptional heart care, we have created designated Provider Care Teams.  These Care Teams include your primary Cardiologist (physician) and Advanced Practice Providers (APPs -  Physician Assistants and Nurse Practitioners) who all work together to provide you with the care you need, when you need it.  We recommend signing up for the patient portal called MyChart.  Sign up information is provided on this After Visit Summary.  MyChart is used to connect with patients for Virtual Visits (Telemedicine).  Patients are able to view lab/test results, encounter notes, upcoming appointments, etc.  Non-urgent messages can be sent to your provider as well.   To learn more about what you can do with MyChart, go to [email or web address].    Your next appointment:   1 month follow up

## 2023-08-21 NOTE — Assessment & Plan Note (Signed)
 Good control. Target below 130 over 80 mmHg.  Continue hydrochlorothiazide 25 once daily.

## 2023-08-21 NOTE — Assessment & Plan Note (Addendum)
 CHA2DS2-VASc score 1 Reportedly diagnosed over 2 to 3 years ago in urgent care facility and has had no further follow-up since.  Remains in atrial relation, rates mildly elevated today in the clinic. Relatively asymptomatic.  Discussed the diagnosis at length. Discussed various triggers. Unknown for how long he has been in it and likely will be difficult for rhythm control without any added antiarrhythmics.  Will proceed with transthoracic echocardiogram to assess cardiac structure and unction.  If no significant abnormalities, we will discuss options for rhythm control with antiarrhythmics.  Nonpharmacological methods to improve rhythm control success with weight loss, regular exercise discussed.  Recommended further evaluation with PCP with regards to sleep apnea evaluation.  Stroke risk discussed. Role for anticoagulation for lowering stroke risk discussed. He is agreeable to proceed with anticoagulation. Will prescribe Eliquis  5 mg twice daily.  As of today the heart rates are poorly controlled in the ER based on EKG and exam. He has minimal symptoms. Will start metoprolol  tartrate 25 mg twice daily.  Will have him return for follow-up visit in 3 to 4 weeks tentatively with an echocardiogram done prior to that.  He is aware if he has any worsening symptoms of fatigue tiredness shortness of breath, palpitations or lightheadedness or passing out episodes, should head to the ER

## 2023-09-10 ENCOUNTER — Ambulatory Visit: Payer: Medicaid Other

## 2023-09-10 DIAGNOSIS — I4819 Other persistent atrial fibrillation: Secondary | ICD-10-CM

## 2023-09-10 LAB — ECHOCARDIOGRAM COMPLETE: S' Lateral: 3.6 cm

## 2023-09-10 MED ORDER — PERFLUTREN LIPID MICROSPHERE
1.0000 mL | INTRAVENOUS | Status: AC | PRN
  Administered 2023-09-10: 5 mL via INTRAVENOUS

## 2023-09-16 ENCOUNTER — Telehealth: Payer: Self-pay | Admitting: Emergency Medicine

## 2023-09-16 NOTE — Telephone Encounter (Signed)
-----   Message from La Rose R Madireddy sent at 09/10/2023  8:56 PM EST ----- Please inform him the test results from echocardiogram today shows normal pumping function of the heart with left ventricular ejection fraction 60 to 65% [55% and above is considered normal].  There were no significant valve abnormalities reported.  Major blood vessel coming out of the heart, called aorta was noted to be mildly dilated measuring 40 mm.  Considering your age and body size, this falls within the normal limits.  Will continue to monitor this with repeat echocardiogram in about 12 months for any significant changes.  If stable, do not expect any further concerns with this.  I will review these results with you further at your follow-up visit.

## 2023-09-16 NOTE — Telephone Encounter (Signed)
 Results reviewed with pt as per Dr. Madireddy's note.  Pt verbalized understanding and had no additional questions. Routed to PCP

## 2023-09-18 ENCOUNTER — Other Ambulatory Visit: Payer: Self-pay

## 2023-09-18 DIAGNOSIS — F32A Depression, unspecified: Secondary | ICD-10-CM | POA: Insufficient documentation

## 2023-09-18 DIAGNOSIS — E039 Hypothyroidism, unspecified: Secondary | ICD-10-CM | POA: Insufficient documentation

## 2023-09-23 ENCOUNTER — Ambulatory Visit: Payer: Medicaid Other

## 2023-09-26 ENCOUNTER — Other Ambulatory Visit: Payer: Self-pay

## 2023-09-29 ENCOUNTER — Ambulatory Visit: Payer: Medicaid Other

## 2023-09-29 VITALS — BP 110/68 | HR 81 | Ht 72.0 in | Wt 339.0 lb

## 2023-09-29 DIAGNOSIS — Z01810 Encounter for preprocedural cardiovascular examination: Secondary | ICD-10-CM

## 2023-09-29 DIAGNOSIS — I4819 Other persistent atrial fibrillation: Secondary | ICD-10-CM

## 2023-09-29 DIAGNOSIS — I77810 Thoracic aortic ectasia: Secondary | ICD-10-CM | POA: Insufficient documentation

## 2023-09-29 DIAGNOSIS — I10 Essential (primary) hypertension: Secondary | ICD-10-CM

## 2023-09-29 HISTORY — DX: Encounter for preprocedural cardiovascular examination: Z01.810

## 2023-09-29 HISTORY — DX: Thoracic aortic ectasia: I77.810

## 2023-09-29 NOTE — Assessment & Plan Note (Signed)
 Well-controlled. Target below 130 over 80 mmHg. Continue metoprolol tartrate 25 mg twice daily. Continue hydrochlorothiazide 25 mg once daily

## 2023-09-29 NOTE — Assessment & Plan Note (Signed)
 CHA2DS2-VASc score of 1. On anticoagulation with Eliquis 5 mg twice daily.  Tolerating well. Continue rate control with metoprolol tartrate 25 mg twice daily.  We once again reviewed rhythm control options for TEE cardioversion. He wishes to proceed with knee arthroplasty. Will hold off on TEE cardioversion at this time for rhythm control until he is done with his knee surgeries and recovers.  Anticipate tentatively to have him come back for follow-up in about 3 months to discuss timing for TEE cardioversion. Discussed the procedures at length. He is agreeable to get this done either at [facility] or [facility] depending on scheduling availability. Anticipate he will need antiarrhythmics given the duration for which he has been in A-fib.

## 2023-09-29 NOTE — Progress Notes (Signed)
 Cardiology Consultation:    Date:  09/29/2023   ID:  Ricky [name], DOB 21-Apr-1961, MRN [id number]  PCP:  Ricky Jabs, [name]  Cardiologist:  Ricky Corporal Alvine Mostafa, MD   Referring MD: Ricky Jabs, [name]   No chief complaint on file.    ASSESSMENT AND PLAN:   [name] is a 63 year old male with history of persistent atrial fibrillation, CHADS2 Vascor 1, on anticoagulation with Eliquis and rate controlled with metoprolol, morbid obesity, prediabetes, hypertension, hypothyroidism, osteoarthritis of the knees, nonobstructive renal stone, diverticulosis, here for follow-up visit, doing well with rate control strategy at this time and is anticipating surgery for bilateral knee osteoarthritis with total knee arthroplasty.  No date scheduled yet.  Requesting preoperative cardiovascular examination for clearance. Problem List Items Addressed This Visit     Essential (primary) hypertension   Well-controlled. Target below 130 over 80 mmHg. Continue metoprolol tartrate 25 mg twice daily. Continue hydrochlorothiazide 25 mg once daily      Persistent atrial fibrillation (HCC) - Primary   CHA2DS2-VASc score of 1. On anticoagulation with Eliquis 5 mg twice daily.  Tolerating well. Continue rate control with metoprolol tartrate 25 mg twice daily.  We once again reviewed rhythm control options for TEE cardioversion. He wishes to proceed with knee arthroplasty. Will hold off on TEE cardioversion at this time for rhythm control until he is done with his knee surgeries and recovers.  Anticipate tentatively to have him come back for follow-up in about 3 months to discuss timing for TEE cardioversion. Discussed the procedures at length. He is agreeable to get this done either at [facility] or [facility] depending on scheduling availability. Anticipate he will need antiarrhythmics given the duration for which he has been in A-fib.        Relevant Orders   EKG 12-Lead (Completed)    Preoperative cardiovascular examination   No active cardiac complaints. Persistent atrial fibrillation good rate control at this time and on anticoagulation with Eliquis. No anginal symptoms, no heart failure, good functional capacity able to walk needing more than 4 METS of activity. Normal biventricular function without any significant valve abnormalities on echocardiogram recently.  From cardiac standpoint okay to proceed with his elective surgery for the knees as being planned. Low risk for perioperative cardiovascular complications. No further cardiac workup indicated at this time. Okay to hold anticoagulation Eliquis prior to procedure as needed and resume postoperatively when cleared by the surgeon.       Ascending aorta dilatation (HCC)   Mild measuring 4 cm ascending aorta on transthoracic echocardiogram January 2025. Likely upper limits of normal considering his body surface area. Will monitor with repeat echocardiogram tentatively in 1 year.       Return to clinic tentatively in 3 months   History of Present Illness:    Ricky [name] is a 63 y.o. male who is being seen today for follow-up visit. Last visit with [place] in the office was 08-21-2023.  PCP is Ricky Jabs, [name].  Has history of morbid obesity, prediabetes, hypertension, hypothyroidism, osteoarthritis of the knees, nonobstructive renal stone, diverticulosis, persistent atrial fibrillation initially diagnosed about 2 to 3 years ago at an urgent care facility and had no further follow-ups.  Recently at [facility] on 08-07-2023 noted with A-fib with rapid ventricular rate in the setting of diarrhea, subsequently establish care with [place] and initiated on Eliquis for stroke prophylaxis and rate control optimized by starting metoprolol tartrate 25 mg twice daily.  Here for follow-up visit  today.  Echocardiogram done 09-10-2023 LVEF 60 to 65%, normal biventricular function, no significant valve abnormalities.   Ascending aorta reported dilated measuring 4 cm in size.  Considering his body surface area this appears with him normal limits.  Will repeat imaging tentatively in 1 year.  Anticipate him getting TEE cardioversion done prior to that.  Heart rates well-controlled today.  Blood pressures hemodynamically stable.  At this time he does not have any active cardiac symptoms.  Has been doing well with both Eliquis and metoprolol, rates have improved.  He recently had a bout of COVID requiring use of Paxlovid for 5 days with good recovery from symptoms.  He briefly reduce the dose of Eliquis to once a day while he was having COVID symptoms and took Paxlovid.  Functional status is good able to walk 20 to 30 minutes, mostly limited due to bilateral knee pain for which she is seeing an orthopedic surgeon and in the works to get elective total knee arthroplasties done.  Denies any chest pain, palpitation, shortness of breath, orthopnea,Paroxysmal nocturnal dyspnea.  No pedal edema. No lightheadedness, dizziness or syncopal episodes.  No blood in urine or stools.  EKG in the clinic today atrial fibrillation heart rate 81/min, incomplete RBBB morphology QRS 108 ms.  In comparison to prior EKG from 08-21-2023 heart rates improved.  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  Lives by himself at home.  He is #9 of 11 siblings.   Does not smoke, drink alcohol or use recreational drugs or illicit drugs.  Past Medical History:  Diagnosis Date   Acquired hypothyroidism 08/11/2017   Adjustment disorder with depressed mood 03/22/2018   Allergic rhinitis 08/11/2017   Allergy to antithrombotic medication 08/11/2017   Anxiety    Benign prostatic hyperplasia with lower urinary tract symptoms 08/16/2019   Bilateral shoulder pain 07/12/2014   Bipolar disorder, unspecified (HCC) 07/14/2019   Chronic fatigue 08/11/2017   Chronic pain of left knee 10/30/2017   Last Assessment & Plan:   Advised Aleve  BID--2 at a time     Depression    Dysthymia 04/26/2019   Formatting of this note might be different from the original.  Trileptal and Celexa and doing OK overall     Last Assessment & Plan:   Formatting of this note might be different from the original.  Sees [facility]     Essential (primary) hypertension 01/05/2021   Hyperlipidemia, unspecified 07/19/2019   Hypogonadism in male 08/11/2017   Last Assessment & Plan:   Cont testosterone 200 every 14d     Hypothyroidism    Impulse control disorder    MDD (major depressive disorder), recurrent episode, severe (HCC) 04/14/2018   Morbid (severe) obesity due to excess calories (HCC) 08/11/2017   Formatting of this note might be different from the original.  Last Assessment & Plan:   Formatting of this note might be different from the original.  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   citalopram (CELEXA) 20 MG tablet Take 1 tablet (20 mg total) by mouth daily. For mood control   fluticasone (FLONASE) 50 MCG/ACT nasal spray Place 2 sprays into both nostrils 2 (two) times daily.   hydrochlorothiazide (HYDRODIURIL) 25 MG tablet Take 25 mg by  mouth daily.   levothyroxine (SYNTHROID, LEVOTHROID) 50 MCG tablet Take 50 mcg by mouth daily.   metoprolol tartrate (LOPRESSOR) 25 MG tablet Take 1 tablet (25 mg total) by mouth 2 (two) times daily.   OXcarbazepine (TRILEPTAL) 150 MG tablet Take 1 tablet (150 mg total) by mouth 2 (two) times daily. For anxiety and mood control     Allergies:   Patient has no known allergies.   Social History   Socioeconomic History   Marital status: Divorced    Spouse name: Not on file   Number of children: 3   Years of education: Not on file   Highest education level: Not on file  Occupational History   Not on file  Tobacco Use   Smoking status: Never   Smokeless tobacco: Never  Substance and Sexual Activity   Alcohol use: Not Currently   Drug use: Not Currently   Sexual activity: Not Currently  Other Topics Concern   Not on file  Social History Narrative   Not on file   Social Drivers of Health   Financial Resource Strain: Not on File (11/15/2021)   Received from [employer]    Financial Resource Strain: 0  Food Insecurity: Not on File (11/15/2021)   Received from [facility] Insecurity    Food: 0  Transportation Needs: Not on File (11/15/2021)   Received from [employer] Needs    Transportation: 0  Physical Activity: Not on File (11/13/2021)   Received from [facility]   Physical Activity    Physical Activity: 0  Stress: Not on File (11/15/2021)   Received from [facility]   Stress    Stress: 0  Social Connections: Not on File (11/15/2021)   Received from [facility]   Social Connections    Connectedness: 0     Family History: The patient's family history includes Cancer in his father; Diabetes in his mother. There is no history of Hypertension or Heart disease. ROS:   Please see the history of present illness.    All 14 point review of systems negative except as described per history of present illness.  EKGs/Labs/Other Studies Reviewed:    The following  studies were reviewed today:   EKG:  EKG Interpretation Date/Time:  Monday September 29 2023 08:07:31 EST Ventricular Rate:  81 PR Interval:    QRS Duration:  108 QT Interval:  384 QTC Calculation: 446 R Axis:   -55  Text Interpretation: Atrial fibrillation Incomplete right bundle branch block Left anterior fascicular block Abnormal ECG When compared with ECG of 21-Aug-2023 15:09, No significant change was found Confirmed by Huntley Dec reddy [phone]) on 09/29/2023 8:15:43 AM    Recent Labs: No results found for requested labs within last 365 days.  Recent Lipid Panel    Component Value Date/Time   CHOL 133 03/24/2018  4401   TRIG 68 03/24/2018 0625   HDL 25 (L) 03/24/2018 0625   CHOLHDL 5.3 03/24/2018 0625   VLDL 14 03/24/2018 0625   LDLCALC 94 03/24/2018 0625    Physical Exam:    VS:  BP 110/68   Pulse 81   Ht 6' (1.829 m)   Wt (!) 339 lb (153.8 kg)   SpO2 98%   BMI 45.98 kg/m     Wt Readings from Last 3 Encounters:  09/29/23 (!) 339 lb (153.8 kg)  08/21/23 (!) 345 lb 12.8 oz (156.9 kg)     GENERAL:  Well nourished, well developed in no acute distress NECK: No JVD CARDIAC: Irregularly irregular, S1 and S2 present, no murmurs, no rubs, no gallops CHEST:  Clear to auscultation without rales, wheezing or rhonchi  Extremities: No pitting pedal edema. Pulses bilaterally symmetric with radial 2+ and dorsalis pedis 2+ NEUROLOGIC:  Alert and oriented x 3  Medication Adjustments/Labs and Tests Ordered: Current medicines are reviewed at length with the patient today.  Concerns regarding medicines are outlined above.  Orders Placed This Encounter  Procedures   EKG 12-Lead   No orders of the defined types were placed in this encounter.   Signed, Jarad Barth reddy Dimetri Armitage, MD, MPH, [facility]. 09/29/2023 8:40 AM    [facility]

## 2023-09-29 NOTE — Assessment & Plan Note (Signed)
 Mild measuring 4 cm ascending aorta on transthoracic echocardiogram January 2025. Likely upper limits of normal considering his body surface area. Will monitor with repeat echocardiogram tentatively in 1 year.

## 2023-09-29 NOTE — Assessment & Plan Note (Signed)
 No active cardiac complaints. Persistent atrial fibrillation good rate control at this time and on anticoagulation with Eliquis. No anginal symptoms, no heart failure, good functional capacity able to walk needing more than 4 METS of activity. Normal biventricular function without any significant valve abnormalities on echocardiogram recently.  From cardiac standpoint okay to proceed with his elective surgery for the knees as being planned. Low risk for perioperative cardiovascular complications. No further cardiac workup indicated at this time. Okay to hold anticoagulation Eliquis prior to procedure as needed and resume postoperatively when cleared by the surgeon.

## 2023-09-29 NOTE — Patient Instructions (Signed)
Medication Instructions:  Your physician recommends that you continue on your current medications as directed. Please refer to the Current Medication list given to you today.  *If you need a refill on your cardiac medications before your next appointment, please call your pharmacy*   Lab Work: None If you have labs (blood work) drawn today and your tests are completely normal, you will receive your results only by: MyChart Message (if you have MyChart) OR A paper copy in the mail If you have any lab test that is abnormal or we need to change your treatment, we will call you to review the results.   Testing/Procedures: None   Follow-Up: At [facility], you and your health needs are our priority.  As part of our continuing mission to provide you with exceptional heart care, we have created designated Provider Care Teams.  These Care Teams include your primary Cardiologist (physician) and Advanced Practice Providers (APPs -  Physician Assistants and Nurse Practitioners) who all work together to provide you with the care you need, when you need it.  We recommend signing up for the patient portal called "MyChart".  Sign up information is provided on this After Visit Summary.  MyChart is used to connect with patients for Virtual Visits (Telemedicine).  Patients are able to view lab/test results, encounter notes, upcoming appointments, etc.  Non-urgent messages can be sent to your provider as well.   To learn more about what you can do with MyChart, go to [email or web address].    Your next appointment:   3 month(s)  Provider:   Huntley Dec, MD    Other Instructions None

## 2023-10-20 ENCOUNTER — Telehealth: Payer: Self-pay

## 2023-10-20 NOTE — Telephone Encounter (Signed)
 Pt c/o swelling/edema: STAT if pt has developed SOB within 24 hours  If swelling, where is the swelling located? Left hand, legs, feet  How much weight have you gained and in what time span? no  Have you gained 2 pounds in a day or 5 pounds in a week? Has not weighed  Do you have a log of your daily weights (if so, list)? no  Are you currently taking a fluid pill? no  Are you currently SOB? yes  Have you traveled recently in a car or plane for an extended period of time?

## 2023-10-21 NOTE — Telephone Encounter (Signed)
 Spoke with pt who states that he has been seen by [facility] ED for same and started on Lasix. Appointment made with Dr. Vincent Gros for 10/23/23. Advised to call his insurance to get scales to do daily weights as pt states that he cannot afford them. Pt verbalized understanding and had no additional questions.

## 2023-10-23 ENCOUNTER — Ambulatory Visit

## 2023-10-23 VITALS — BP 94/64 | HR 63 | Ht 72.0 in | Wt 341.0 lb

## 2023-10-23 DIAGNOSIS — I4819 Other persistent atrial fibrillation: Secondary | ICD-10-CM

## 2023-10-23 DIAGNOSIS — I5033 Acute on chronic diastolic (congestive) heart failure: Secondary | ICD-10-CM

## 2023-10-23 DIAGNOSIS — I5032 Chronic diastolic (congestive) heart failure: Secondary | ICD-10-CM | POA: Insufficient documentation

## 2023-10-23 DIAGNOSIS — I10 Essential (primary) hypertension: Secondary | ICD-10-CM

## 2023-10-23 HISTORY — DX: Acute on chronic diastolic (congestive) heart failure: I50.33

## 2023-10-23 HISTORY — DX: Chronic diastolic (congestive) heart failure: I50.32

## 2023-10-23 MED ORDER — FUROSEMIDE 40 MG PO TABS: 40.0000 mg | ORAL_TABLET | Freq: Every day | ORAL | 10 refills | Status: DC

## 2023-10-23 NOTE — Assessment & Plan Note (Addendum)
 Hypervolemic. Appears compensated otherwise. Mentions weight is improved since started on Lasix at the ER visit 3 days ago.  Advised him to keep salt restricted to below 2 g/day and fluid restricted to below 2 L/day. He is generally adherent with the diet salt restrictions consuming oatmeal and baked chicken.  Weight today 341 pounds. Advised him to monitor his weights daily at home.  He is in the process of obtaining a weighing scale.  Continue with furosemide, titrate up the dose to 40 mg twice daily for the next 7 days after which he will cut down to 40 mg once a day. Will obtain blood work with BMP and proBNP tentatively in 10 days.   Blood pressures are soft at this time to initiate SGLT2 inhibitors or ACE inhibitors.  Discontinue hydrochlorothiazide.  Will have him return to clinic for follow-up tentatively in 1 month to further optimize his regimen.

## 2023-10-23 NOTE — Progress Notes (Signed)
 Cardiology Consultation:    Date:  10/23/2023   ID:  Ricky [name], DOB 13-May-1961, MRN [id number]  PCP:  Claybon Jabs, [name]  Cardiologist:  Marlyn Corporal Lamond Glantz, MD   Referring MD: Claybon Jabs, [name]   Chief Complaint  Patient presents with   Hospitalization Follow-up     ASSESSMENT AND PLAN:   [name] 63 year old male with history of persistent atrial fibrillation CHADS2 Vascor of 1 for hypertension, normal biventricular function LVEF 60 to 65% on echocardiogram January 2025, morbid obesity, prediabetes, hypertension, hypothyroidism, osteoarthritis of the knee surgery on hold until he is able to lose another 40 pounds, nonobstructive renal stone, diverticulosis, ascending aorta diameter 4 cm by echocardiogram January 2025, now presumably with acute on chronic diastolic heart failure in the setting of bilateral lower extremity edema.   Problem List Items Addressed This Visit     Essential (primary) hypertension - Primary   Relevant Medications   furosemide (LASIX) 40 MG tablet   Other Relevant Orders   Pro b natriuretic peptide (BNP)   Basic metabolic panel   Persistent atrial fibrillation (HCC)   CHADS2 vasc score of 1 Continue anticoagulation with Eliquis 5 mg twice daily.  Continue metoprolol tartrate 25 mg twice daily for rate control. Given his knee surgeries currently on hold, will consider rhythm control once his heart failure symptoms are optimized and he is on a stable regimen.       Relevant Medications   furosemide (LASIX) 40 MG tablet   Other Relevant Orders   Pro b natriuretic peptide (BNP)   Basic metabolic panel   Acute on chronic diastolic heart failure (HCC)   Hypervolemic. Appears compensated otherwise. Mentions weight is improved since started on Lasix at the ER visit 3 days ago.  Advised him to keep salt restricted to below 2 g/day and fluid restricted to below 2 L/day. He is generally adherent with the diet salt restrictions consuming oatmeal and  baked chicken.  Weight today 341 pounds. Advised him to monitor his weights daily at home.  He is in the process of obtaining a weighing scale.  Continue with furosemide, titrate up the dose to 40 mg twice daily for the next 7 days after which he will cut down to 40 mg once a day. Will obtain blood work with BMP and proBNP tentatively in 10 days.   Blood pressures are soft at this time to initiate SGLT2 inhibitors or ACE inhibitors.  Discontinue hydrochlorothiazide.  Will have him return to clinic for follow-up tentatively in 1 month to further optimize his regimen.      Relevant Medications   furosemide (LASIX) 40 MG tablet      History of Present Illness:    Ricky [name] is a 63 y.o. male who is being seen today for follow-up visit. Recent visit with me in the office was 09-29-2023. PCP is Claybon Jabs, [name].  Persistent atrial fibrillation CHA2DS2-VASc score 1 on anticoagulation with Eliquis and on rate control strategy, normal biventricular function with a EF 60 to 65% on echocardiogram January 2025, morbid obesity, prediabetes, hypertension, hypothyroidism, osteoarthritis of the knees surgery on hold until he loses about 40 pounds, nonobstructive renal stone, diverticulosis, ascending aorta diameter 4 cm by TTE January 2025 [mostly upper limits of normal considering his body surface area], now diagnosed with diastolic heart failure.  Recently at [facility] 10-20-2023 for bilateral lower extremity swelling, hemodynamically was stable with blood pressure 147/87 mmHg, blood work showed hemoglobin 13.3, hematocrit 39.6, troponin I less  than 0.01, proBNP elevated 753, BUN 26, creatinine 0.7, EGFR 105. Chest x-ray was unremarkable.  He was started on furosemide 40 mg daily and discharged home for follow-up with [place].  Mentions overall he is doing better.  But mentions he has been sleeping in a recliner.  Lower extremity swelling has improved significantly.  Mentions the  swelling came up in about a week. He has been diuresing well with the current dose of furosemide.  Attributes the weight gain was related to salty foods he consumed over the prior week.  Denies any chest pain, palpitations  denies any blood in urine or stools  Does not smoke or drink alcohol. Lives by himself.  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  Past Medical History:  Diagnosis Date   Acquired hypothyroidism 08/11/2017   Adjustment disorder with depressed mood 03/22/2018   Allergic rhinitis 08/11/2017   Allergy to antithrombotic medication 08/11/2017   Anxiety    Benign prostatic hyperplasia with lower urinary tract symptoms 08/16/2019   Bilateral shoulder pain 07/12/2014   Bipolar disorder, unspecified (HCC) 07/14/2019   Chronic fatigue 08/11/2017   Chronic pain of left knee 10/30/2017   Last Assessment & Plan:   Advised Aleve BID--2 at a time     Depression    Dysthymia 04/26/2019   Formatting of this note might be different from the original.  Trileptal and Celexa and doing OK overall     Last Assessment & Plan:   Formatting of this note might be different from the original.  Sees [facility]     Essential (primary) hypertension 01/05/2021   Hyperlipidemia, unspecified 07/19/2019   Hypogonadism in male 08/11/2017   Last Assessment & Plan:   Cont testosterone 200 every 14d     Hypothyroidism    Impulse control disorder    MDD (major depressive disorder), recurrent episode, severe (HCC) 04/14/2018   Morbid (severe) obesity due to excess calories (HCC) 08/11/2017   Formatting of this note might be different from the original.  Last Assessment & Plan:   Formatting of this note might be different from the original.  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   citalopram (CELEXA) 20 MG tablet Take 1 tablet (20 mg total) by mouth daily. For mood control   fluticasone (FLONASE) 50 MCG/ACT nasal spray Place 2 sprays into both nostrils 2 (two) times daily.   levothyroxine (SYNTHROID, LEVOTHROID) 50 MCG tablet Take 50 mcg by mouth daily.   metoprolol tartrate (LOPRESSOR) 25 MG tablet Take 1 tablet (25 mg total) by mouth 2 (two) times daily.   OXcarbazepine (TRILEPTAL) 150 MG tablet Take 1  tablet (150 mg total) by mouth 2 (two) times daily. For anxiety and mood control   [DISCONTINUED] furosemide (LASIX) 40 MG tablet Take 40 mg by mouth daily.   [DISCONTINUED] hydrochlorothiazide (HYDRODIURIL) 25 MG tablet Take 25 mg by mouth daily.     Allergies:   Patient has no known allergies.   Social History   Socioeconomic History   Marital status: Divorced    Spouse name: Not on file   Number of children: 3   Years of education: Not on file   Highest education level: Not on file  Occupational History   Not on file  Tobacco Use   Smoking status: Never   Smokeless tobacco: Never  Substance and Sexual Activity   Alcohol use: Not Currently   Drug use: Not Currently   Sexual activity: Not Currently  Other Topics Concern   Not on file  Social History Narrative   Not on file   Social Drivers of Health   Financial Resource Strain: Not on File  (11/15/2021)   Received from [employer]    Financial Resource Strain: 0  Food Insecurity: Not on File (11/15/2021)   Received from [facility] Insecurity    Food: 0  Transportation Needs: Not on File (11/15/2021)   Received from [employer] Needs    Transportation: 0  Physical Activity: Not on File (11/13/2021)   Received from [facility]   Physical Activity    Physical Activity: 0  Stress: Not on File (11/15/2021)   Received from [facility]   Stress    Stress: 0  Social Connections: Not on File (11/15/2021)   Received from [facility]   Social Connections    Connectedness: 0     Family History: The patient's family history includes Cancer in his father; Diabetes in his mother. There is no history of Hypertension or Heart disease. ROS:   Please see the history of present illness.    All 14 point review of systems negative except as described per history of present illness.  EKGs/Labs/Other Studies Reviewed:    The following studies were reviewed today:   EKG:       Recent Labs: No results found for requested labs within last 365 days.  Recent Lipid Panel    Component Value Date/Time   CHOL 133 03/24/2018 0625   TRIG 68 03/24/2018 0625   HDL 25 (L) 03/24/2018 0625   CHOLHDL 5.3 03/24/2018 0625   VLDL 14 03/24/2018 0625   LDLCALC 94 03/24/2018 0625    Physical Exam:    VS:  BP 94/64 (BP Location: Right Arm, Patient Position: Sitting)   Pulse 63   Ht 6' (1.829 m)   Wt (!) 341 lb (154.7 kg)   SpO2 98%   BMI 46.25 kg/m     Wt Readings from Last 3 Encounters:  10/23/23 (!) 341 lb (154.7 kg)  09/29/23 (!) 339 lb (153.8 kg)  08/21/23 (!) 345 lb 12.8 oz (156.9 kg)     GENERAL:  Well nourished, well developed in no acute distress NECK: No JVD; No carotid bruits CARDIAC: Irregularly irregular., S1 and S2 present, no murmurs, no rubs, no gallops CHEST:  Clear to auscultation without rales, wheezing or rhonchi  Extremities: Bilateral 2 pitting pedal  edema. Pulses bilaterally symmetric with radial 2+ and dorsalis pedis 2+ NEUROLOGIC:  Alert and oriented x 3  Medication Adjustments/Labs and Tests Ordered: Current medicines are reviewed at length with the patient today.  Concerns regarding  medicines are outlined above.  Orders Placed This Encounter  Procedures   Pro b natriuretic peptide (BNP)   Basic metabolic panel   Meds ordered this encounter  Medications   furosemide (LASIX) 40 MG tablet    Sig: Take 1 tablet (40 mg total) by mouth daily.    Dispense:  30 tablet    Refill:  10    Signed, Shamal Stracener reddy Tekila Caillouet, MD, MPH, [facility]. 10/23/2023 11:44 AM    [facility]

## 2023-10-23 NOTE — Assessment & Plan Note (Signed)
 CHADS2 vasc score of 1 Continue anticoagulation with Eliquis 5 mg twice daily.  Continue metoprolol tartrate 25 mg twice daily for rate control. Given his knee surgeries currently on hold, will consider rhythm control once his heart failure symptoms are optimized and he is on a stable regimen.

## 2023-10-23 NOTE — Patient Instructions (Addendum)
 Keep a daily weight log.   Medication Instructions:   Stop hydrochlorothiazide.  Increase Lasix to 40 mg twice a day for the next 7 days. After the 7th day, take Lasix 40 mg once a day.  *If you need a refill on your cardiac medications before your next appointment, please call your pharmacy*   Lab Work: Your physician recommends that you return for lab work in:  10 days You can come Monday through Friday 8:30 am to 12:00 pm and 1:15 to 4:30. You do not need to make an appointment as the order has already been placed. The labs you are going to have done are BMET, and Pro BNP   If you have labs (blood work) drawn today and your tests are completely normal, you will receive your results only by: MyChart Message (if you have MyChart) OR A paper copy in the mail If you have any lab test that is abnormal or we need to change your treatment, we will call you to review the results.   Testing/Procedures: None Ordered   Follow-Up: At [facility], you and your health needs are our priority.  As part of our continuing mission to provide you with exceptional heart care, we have created designated Provider Care Teams.  These Care Teams include your primary Cardiologist (physician) and Advanced Practice Providers (APPs -  Physician Assistants and Nurse Practitioners) who all work together to provide you with the care you need, when you need it.  We recommend signing up for the patient portal called "MyChart".  Sign up information is provided on this After Visit Summary.  MyChart is used to connect with patients for Virtual Visits (Telemedicine).  Patients are able to view lab/test results, encounter notes, upcoming appointments, etc.  Non-urgent messages can be sent to your provider as well.   To learn more about what you can do with MyChart, go to [email or web address].    Your next appointment:   1 month follow up

## 2023-11-11 LAB — BASIC METABOLIC PANEL WITH GFR
BUN/Creatinine Ratio: 30 — ABNORMAL HIGH (ref 10–24)
BUN: 25 mg/dL (ref 8–27)
CO2: 23 mmol/L (ref 20–29)
Calcium: 8.8 mg/dL (ref 8.6–10.2)
Chloride: 106 mmol/L (ref 96–106)
Creatinine, Ser: 0.83 mg/dL (ref 0.76–1.27)
Glucose: 75 mg/dL (ref 70–99)
Potassium: 4.1 mmol/L (ref 3.5–5.2)
Sodium: 141 mmol/L (ref 134–144)
eGFR: 100 mL/min/{1.73_m2} (ref >59)

## 2023-11-11 LAB — PRO B NATRIURETIC PEPTIDE: NT-Pro BNP: 501 pg/mL — ABNORMAL HIGH (ref 0–210)

## 2023-11-24 ENCOUNTER — Ambulatory Visit (HOSPITAL_BASED_OUTPATIENT_CLINIC_OR_DEPARTMENT_OTHER): Admitting: Student

## 2023-11-24 ENCOUNTER — Encounter (HOSPITAL_BASED_OUTPATIENT_CLINIC_OR_DEPARTMENT_OTHER): Payer: Self-pay | Admitting: Student

## 2023-11-24 VITALS — BP 106/65 | HR 81 | Temp 98.7°F | Resp 16 | Ht 66.34 in | Wt 330.7 lb

## 2023-11-24 DIAGNOSIS — F319 Bipolar disorder, unspecified: Secondary | ICD-10-CM

## 2023-11-24 DIAGNOSIS — I10 Essential (primary) hypertension: Secondary | ICD-10-CM | POA: Diagnosis not present

## 2023-11-24 DIAGNOSIS — E785 Hyperlipidemia, unspecified: Secondary | ICD-10-CM

## 2023-11-24 DIAGNOSIS — Z7689 Persons encountering health services in other specified circumstances: Secondary | ICD-10-CM

## 2023-11-24 DIAGNOSIS — J300 Vasomotor rhinitis: Secondary | ICD-10-CM

## 2023-11-24 DIAGNOSIS — E039 Hypothyroidism, unspecified: Secondary | ICD-10-CM | POA: Diagnosis not present

## 2023-11-24 DIAGNOSIS — I4819 Other persistent atrial fibrillation: Secondary | ICD-10-CM

## 2023-11-24 DIAGNOSIS — M1711 Unilateral primary osteoarthritis, right knee: Secondary | ICD-10-CM

## 2023-11-24 DIAGNOSIS — Z6841 Body Mass Index (BMI) 40.0 and over, adult: Secondary | ICD-10-CM

## 2023-11-24 DIAGNOSIS — N529 Male erectile dysfunction, unspecified: Secondary | ICD-10-CM

## 2023-11-24 DIAGNOSIS — I77810 Thoracic aortic ectasia: Secondary | ICD-10-CM | POA: Diagnosis not present

## 2023-11-24 DIAGNOSIS — Z1211 Encounter for screening for malignant neoplasm of colon: Secondary | ICD-10-CM

## 2023-11-24 MED ORDER — FLUTICASONE PROPIONATE 50 MCG/ACT NA SUSP: 2.0000 | Freq: Two times a day (BID) | NASAL | 5 refills | Status: DC

## 2023-11-24 MED ORDER — LEVOTHYROXINE SODIUM 50 MCG PO TABS: 50.0000 ug | ORAL_TABLET | Freq: Every day | ORAL | 6 refills | Status: DC

## 2023-11-24 NOTE — Assessment & Plan Note (Signed)
 Refill flonase

## 2023-11-24 NOTE — Assessment & Plan Note (Signed)
 Well-managed on levothyroxine 50 mcg. Previously took medication with food, affecting absorption. Educated on proper administration (30 minutes before food with water). - Order thyroid function tests - Educate on proper levothyroxine administration - Refill levothyroxine prescription

## 2023-11-24 NOTE — Assessment & Plan Note (Signed)
 Stable and well controlled on dietary measures. Continue implementation of lifestyle changes.

## 2023-11-24 NOTE — Progress Notes (Signed)
 New Patient Office Visit  Subjective    Patient ID: Ricky [name], male    DOB: [birth year]  Age: 63 y.o. MRN: [id number]  CC:  Chief Complaint  Patient presents with   Establish Care    Here to establish care. Was diagnosed with A-fib around Christmas 2024. Seeing [facility] in [place]. Due for shingrix, colon cancer screening, and PNA vaccine.     Discussed the use of AI scribe software for clinical note transcription with the patient, who gave verbal consent to proceed.  History of Present Illness   Ricky [name] is a 63 year old male with atrial fibrillation and bipolar disorder who presents to establish care and update medications.  He previously received care at [facility] and then switched to [facility], where he last had a physical six months ago. He has not been in contact with them for the past six months and is seeking a more reliable provider.  He has a history of atrial fibrillation, discovered after experiencing prolonged symptoms of feeling unwell, leading to early retirement. He is on metoprolol for rate control and Eliquis for anticoagulation. He previously took hydrochlorothiazide but was discontinued due to low blood pressure and dizziness. He had an echocardiogram in January and is awaiting a cardioversion procedure. No chest pain, shortness of breath, or palpitations.  He is currently taking levothyroxine 50 mcg daily in the morning, though he has been taking it with food.  He is on Celexa 25 mg, which is managed by [facility] for his psychiatric conditions, including bipolar disorder, depression, and anxiety. His care with Floydene Flock is going well.  He experiences significant knee pain and is under the care of [facility] for this. He has received injections that provided some relief and is considering a nerve block. He has lost nine pounds and is working towards losing thirty more. He has cut out red meat and soda, drinks water and cranberry juice, and  eats baked chicken and vegetables regularly. He is unable to exercise much due to knee pain but remains active and uses a stationary bike.  He reports an enlarged aorta, which is being monitored with plans for reassessment in January of next year.  He also has diverticulosis, diagnosed via CT scan after experiencing abdominal issues. He has not had a colonoscopy but is considering Cologuard for colon cancer screening.  He has a history of low testosterone and self-administers testosterone shots. He has not had his levels checked recently.  He has a history of kidney stones and maintains hydration with water and cranberry juice. He does not smoke or drink alcohol and has tried to maintain a low-salt diet. 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 He has a son who is autistic.     Screenings:  Colon Cancer: Cologuard Lung Cancer: no Diabetes: indicated HLD: indicated  The 10-year ASCVD risk score (Arnett DK, et al., 2019) is: 6.2%  Outpatient Encounter Medications as of 11/24/2023  Medication Sig   albuterol (VENTOLIN HFA) 108 (90 Base) MCG/ACT inhaler Inhale 2 puffs into the lungs every 12 (twelve) hours.   apixaban (ELIQUIS) 5 MG TABS tablet Take 1 tablet (5 mg total) by mouth 2 (two) times daily.   citalopram (CELEXA) 20 MG tablet Take 1 tablet (20 mg total) by mouth daily. For mood control   furosemide (LASIX) 40 MG tablet Take 1 tablet (40 mg total) by mouth daily.   OXcarbazepine (TRILEPTAL) 150 MG tablet Take 1 tablet (150  mg total) by mouth 2 (two) times daily. For anxiety and mood control   [DISCONTINUED] fluticasone (FLONASE) 50 MCG/ACT nasal spray Place 2 sprays into both nostrils 2 (two) times daily.   [DISCONTINUED] levothyroxine (SYNTHROID, LEVOTHROID) 50 MCG tablet Take 50 mcg by mouth daily.   fluticasone (FLONASE) 50 MCG/ACT nasal spray Place 2 sprays into both nostrils 2 (two) times daily.   levothyroxine (SYNTHROID) 50 MCG tablet Take 1 tablet (50 mcg  total) by mouth daily.   metoprolol tartrate (LOPRESSOR) 25 MG tablet Take 1 tablet (25 mg total) by mouth 2 (two) times daily.   No facility-administered encounter medications on file as of 11/24/2023.    Past Medical History:  Diagnosis Date   A-fib [facility])    Acquired hypothyroidism 08/11/2017   Adjustment disorder with depressed mood 03/22/2018   Allergic rhinitis 08/11/2017   Allergy to antithrombotic medication 08/11/2017   Anxiety    Benign prostatic hyperplasia with lower urinary tract symptoms 08/16/2019   Bilateral shoulder pain 07/12/2014   Bipolar disorder, unspecified (HCC) 07/14/2019   Chronic fatigue 08/11/2017   Chronic pain of left knee 10/30/2017   Last Assessment & Plan:   Advised Aleve BID--2 at a time     Depression    Diverticulitis    Dysthymia 04/26/2019   Formatting of this note might be different from the original.  Trileptal and Celexa and doing OK overall     Last Assessment & Plan:   Formatting of this note might be different from the original.  Sees [facility]     Essential (primary) hypertension 01/05/2021   Hyperlipidemia, unspecified 07/19/2019   Hypogonadism in male 08/11/2017   Last Assessment & Plan:   Cont testosterone 200 every 14d     Hypothyroidism    Impulse control disorder    MDD (major depressive disorder), recurrent episode, severe (HCC) 04/14/2018   Morbid (severe) obesity due to excess calories (HCC) 08/11/2017   Formatting of this note might be different from the original.  Last Assessment & Plan:   Formatting of this note might be different from the original.  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   Last Assessment & Plan:   Trial Sildenatil 20 2 to 3 1 hr before  $80 for 50     Vasomotor rhinitis 09/02/2017    Past Surgical History:  Procedure Laterality Date   HERNIA REPAIR     MOUTH SURGERY      Family History  Problem Relation Age of Onset   Diabetes Mother    Cancer Father    Hypertension Neg Hx    Heart disease Neg Hx     Social History   Socioeconomic History   Marital status: Divorced    Spouse name: Not on file   Number of children: 3   Years of education: Not on file   Highest education level: Not on file  Occupational History   Not on file  Tobacco Use   Smoking status: Never    Passive exposure: Never   Smokeless tobacco: Never  Tobacco comments:    Had 11 siblings.   Vaping Use   Vaping status: Never Used  Substance and Sexual Activity   Alcohol use: Not Currently   Drug use: Not Currently    Types: Marijuana   Sexual activity: Not Currently  Other Topics Concern   Not on file  Social History Narrative   Not on file   Social Drivers of Health   Financial Resource Strain: Not on File (11/15/2021)   Received from [employer]    Financial Resource Strain: 0  Food Insecurity: No Food Insecurity (11/24/2023)   Hunger Vital Sign    Worried About Running Out of Food in the Last Year: Never true    Ran Out of Food in the Last Year: Never true  Transportation Needs: No Transportation Needs (11/24/2023)   PRAPARE - Administrator, Civil Service (Medical): No    Lack of Transportation (Non-Medical): No  Physical Activity: Not on File (11/13/2021)   Received [facility]   Physical Activity    Physical Activity: 0  Stress: Not on File (11/15/2021)   [facility]   Stress    Stress: 0  Social Connections: Not on File (11/15/2021)   Received from [facility]   Social  Connections    Connectedness: 0  Intimate Partner Violence: Not At Risk (11/24/2023)   Humiliation, Afraid, Rape, and Kick questionnaire    Fear of Current or Ex-Partner: No    Emotionally Abused: No    Physically Abused: No    Sexually Abused: No    ROS  Per HPI      Objective    BP 106/65   Pulse 81   Temp 98.7 F (37.1 C) (Oral)   Resp 16   Ht 5' 6.34" (1.685 m)   Wt (!) 330 lb 11.2 oz (150 kg)   SpO2 95%   BMI 52.83 kg/m   Physical Exam Constitutional:      General: He is not in acute distress.    Appearance: Normal appearance. He is obese. He is not ill-appearing.  HENT:     Head: Normocephalic and atraumatic.     Right Ear: External ear normal.     Left Ear: External ear normal.     Nose: Nose normal.     Mouth/Throat:     Mouth: Mucous membranes are moist.     Pharynx: Oropharynx is clear.     Comments: Mallampati score 2-3. Eyes:     General: No scleral icterus.    Extraocular Movements: Extraocular movements intact.     Conjunctiva/sclera: Conjunctivae normal.     Pupils: Pupils are equal, round, and reactive to light.  Neck:     Vascular: No carotid bruit.  Cardiovascular:     Rate and Rhythm: Normal rate and regular rhythm.     Pulses: Normal pulses.     Heart sounds: Normal heart sounds. No murmur heard.    No friction rub.  Pulmonary:     Effort: Pulmonary effort is normal. No respiratory distress.     Breath sounds: Normal breath sounds. No wheezing, rhonchi or rales.  Musculoskeletal:        General: Normal range of motion.     Cervical back: Neck supple.     Right lower leg: No edema.     Left lower leg: No edema.  Skin:    General: Skin is warm and dry.     Coloration: Skin is not jaundiced or  pale.  Neurological:     General: No focal deficit present.     Mental Status: He is alert.     Deep Tendon Reflexes: Reflexes normal.  Psychiatric:        Mood and Affect: Mood normal.        Behavior: Behavior normal.          Assessment & Plan:   Encounter to establish care -     Hepatic function panel; Future  Essential (primary) hypertension Assessment & Plan: Well controlled and stable.  - Was previously taken off of hydrochlorothiazide due to hypotension. - Continue metoprolol.  Orders: -     CBC with Differential/Platelet; Future -     Hepatic function panel; Future  Persistent atrial fibrillation (HCC) Assessment & Plan: CHADS VASc score 1. Chronic atrial fibrillation managed with metoprolol for rate control and Eliquis for anticoagulation. Hydrochlorothiazide was discontinued due to hypotension and dizziness. No palpitations or chest pain since discontinuation. Cardioversion is planned but not yet scheduled. - Continue metoprolol and Eliquis - Monitor heart rate and blood pressure - Follow up with cardiology for potential cardioversion  Orders: -     CBC with Differential/Platelet; Future -     Hepatic function panel; Future  Ascending aorta dilatation [facility]) Assessment & Plan: 4cm dilation on TEE in January 2025 -continue to follow with cardiology - f/u in 1 year with cardiology for repeat imaging of aorta to assess for further dilation   Acquired hypothyroidism Assessment & Plan: Well-managed on levothyroxine 50 mcg. Previously took medication with food, affecting absorption. Educated on proper administration (30 minutes before food with water). - Order thyroid function tests - Educate on proper levothyroxine administration - Refill levothyroxine prescription  Orders: -     TSH + free T4; Future -     Levothyroxine Sodium; Take 1 tablet (50 mcg total) by mouth daily.  Dispense: 30 tablet; Refill: 6  Benign prostatic hyperplasia with lower urinary tract symptoms, symptom details unspecified  Morbid obesity with BMI of 45.0-49.9, adult (HCC) -     Lipid panel; Future -     Hemoglobin A1c; Future -     Hepatic function panel; Future  Hyperlipidemia, unspecified hyperlipidemia  type Assessment & Plan: Stable and well controlled on dietary measures. Continue implementation of lifestyle changes.  Orders: -     Lipid panel; Future -     Hepatic function panel; Future  BMI 50.0-59.9, adult (HCC) -     Hepatic function panel; Future  Screen for colon cancer -     Cologuard  Vasomotor rhinitis Assessment & Plan: Refill flonase.  Orders: -     Fluticasone Propionate; Place 2 sprays into both nostrils 2 (two) times daily.  Dispense: 18.2 mL; Refill: 5  Vasculogenic erectile dysfunction, unspecified vasculogenic erectile dysfunction type Assessment & Plan: History of sildenafil use and T injections. Would like T level drawn, will plan to have early morning T level drawn. Discussed possible risks of T therapy.  Orders: -     PSA; Future -     Testosterone; Future  Primary osteoarthritis of right knee Assessment & Plan: Severe bilateral knee osteoarthritis with significant pain. Received injections with some relief. Advised to lose 30 pounds before considering total knee replacement. Discussed genicular nerve block for pain management with a 70% success rate. - Continue weight loss efforts - Follow up with sports medicine in three months - Consider genicular nerve block for pain management - Plan for total knee replacement after weight  loss   Bipolar affective disorder, remission status unspecified (HCC) Assessment & Plan: Managed by psychiatry at [facility] with medications including Celexa. Reports good symptom management with current psychiatric care. - Continue psychiatric care with [facility] - Ensure psychiatric medications are managed by psychiatry   Return in about 3 months (around 02/23/2024), or if symptoms worsen or fail to improve, for Chronic Followup.   Korrin Waterfield T Sofiya Ezelle, [name]

## 2023-11-24 NOTE — Assessment & Plan Note (Signed)
 History of sildenafil use and T injections. Would like T level drawn, will plan to have early morning T level drawn. Discussed possible risks of T therapy.

## 2023-11-24 NOTE — Assessment & Plan Note (Signed)
 Managed by psychiatry at [facility] with medications including Celexa. Reports good symptom management with current psychiatric care. - Continue psychiatric care with [facility] - Ensure psychiatric medications are managed by psychiatry

## 2023-11-24 NOTE — Assessment & Plan Note (Signed)
 Severe bilateral knee osteoarthritis with significant pain. Received injections with some relief. Advised to lose 30 pounds before considering total knee replacement. Discussed genicular nerve block for pain management with a 70% success rate. - Continue weight loss efforts - Follow up with sports medicine in three months - Consider genicular nerve block for pain management - Plan for total knee replacement after weight loss

## 2023-11-24 NOTE — Assessment & Plan Note (Signed)
 CHADS VASc score 1. Chronic atrial fibrillation managed with metoprolol for rate control and Eliquis for anticoagulation. Hydrochlorothiazide was discontinued due to hypotension and dizziness. No palpitations or chest pain since discontinuation. Cardioversion is planned but not yet scheduled. - Continue metoprolol and Eliquis - Monitor heart rate and blood pressure - Follow up with cardiology for potential cardioversion

## 2023-11-24 NOTE — Assessment & Plan Note (Signed)
 4cm dilation on TEE in January 2025 -continue to follow with cardiology - f/u in 1 year with cardiology for repeat imaging of aorta to assess for further dilation

## 2023-11-24 NOTE — Assessment & Plan Note (Signed)
 Well controlled and stable.  - Was previously taken off of hydrochlorothiazide due to hypotension. - Continue metoprolol.

## 2023-11-24 NOTE — Patient Instructions (Signed)
 It was nice to see you today!  As we discussed in clinic I will have you come back for a lab visit- please come fasting (only water or coffee that morning).  Please make sure to take your levothyroxine every morning about 30 minutes before eating or taking any other medications.  If you have any problems before your next visit feel free to message me via MyChart (minor issues or questions) or call the office, otherwise you may reach out to schedule an office visit.  Thank you! Zaliyah Meikle, [name]

## 2023-11-25 DIAGNOSIS — K5792 Diverticulitis of intestine, part unspecified, without perforation or abscess without bleeding: Secondary | ICD-10-CM | POA: Insufficient documentation

## 2023-11-27 ENCOUNTER — Other Ambulatory Visit (INDEPENDENT_AMBULATORY_CARE_PROVIDER_SITE_OTHER)

## 2023-11-27 DIAGNOSIS — I10 Essential (primary) hypertension: Secondary | ICD-10-CM

## 2023-11-27 DIAGNOSIS — I4819 Other persistent atrial fibrillation: Secondary | ICD-10-CM

## 2023-11-27 DIAGNOSIS — Z7689 Persons encountering health services in other specified circumstances: Secondary | ICD-10-CM

## 2023-11-27 DIAGNOSIS — Z23 Encounter for immunization: Secondary | ICD-10-CM | POA: Diagnosis not present

## 2023-11-27 DIAGNOSIS — N529 Male erectile dysfunction, unspecified: Secondary | ICD-10-CM

## 2023-11-27 DIAGNOSIS — E785 Hyperlipidemia, unspecified: Secondary | ICD-10-CM

## 2023-11-27 DIAGNOSIS — E039 Hypothyroidism, unspecified: Secondary | ICD-10-CM

## 2023-11-27 DIAGNOSIS — Z6841 Body Mass Index (BMI) 40.0 and over, adult: Secondary | ICD-10-CM

## 2023-11-28 LAB — LIPID PANEL
Chol/HDL Ratio: 3.2 ratio (ref 0.0–5.0)
Cholesterol, Total: 133 mg/dL (ref 100–199)
HDL: 41 mg/dL (ref >39)
LDL Chol Calc (NIH): 81 mg/dL (ref 0–99)
Triglycerides: 51 mg/dL (ref 0–149)
VLDL Cholesterol Cal: 11 mg/dL (ref 5–40)

## 2023-11-28 LAB — HEPATIC FUNCTION PANEL
ALT: 22 IU/L (ref 0–44)
AST: 20 IU/L (ref 0–40)
Albumin: 3.9 g/dL (ref 3.8–4.9)
Alkaline Phosphatase: 147 IU/L — ABNORMAL HIGH (ref 44–121)
Bilirubin Total: 0.5 mg/dL (ref 0.0–1.2)
Bilirubin, Direct: 0.21 mg/dL (ref 0.00–0.40)
Total Protein: 5.9 g/dL — ABNORMAL LOW (ref 6.0–8.5)

## 2023-11-28 LAB — CBC WITH DIFFERENTIAL/PLATELET
Basophils Absolute: 0 10*3/uL (ref 0.0–0.2)
Basos: 1 %
EOS (ABSOLUTE): 0.1 10*3/uL (ref 0.0–0.4)
Eos: 2 %
Hematocrit: 39.6 % (ref 37.5–51.0)
Hemoglobin: 12.6 g/dL — ABNORMAL LOW (ref 13.0–17.7)
Immature Grans (Abs): 0 10*3/uL (ref 0.0–0.1)
Immature Granulocytes: 0 %
Lymphocytes Absolute: 1.3 10*3/uL (ref 0.7–3.1)
Lymphs: 19 %
MCH: 30.1 pg (ref 26.6–33.0)
MCHC: 31.8 g/dL (ref 31.5–35.7)
MCV: 95 fL (ref 79–97)
Monocytes Absolute: 0.5 10*3/uL (ref 0.1–0.9)
Monocytes: 7 %
Neutrophils Absolute: 4.9 10*3/uL (ref 1.4–7.0)
Neutrophils: 71 %
Platelets: 203 10*3/uL (ref 150–450)
RBC: 4.19 x10E6/uL (ref 4.14–5.80)
RDW: 12.7 % (ref 11.6–15.4)
WBC: 6.8 10*3/uL (ref 3.4–10.8)

## 2023-11-28 LAB — PSA: Prostate Specific Ag, Serum: <0.1 ng/mL (ref 0.0–4.0)

## 2023-11-28 LAB — TSH+FREE T4
Free T4: 1.27 ng/dL (ref 0.82–1.77)
TSH: 1.13 u[IU]/mL (ref 0.450–4.500)

## 2023-11-28 LAB — TESTOSTERONE: Testosterone: 7 ng/dL — ABNORMAL LOW (ref 264–916)

## 2023-11-28 LAB — HEMOGLOBIN A1C
Est. average glucose Bld gHb Est-mCnc: 111 mg/dL
Hgb A1c MFr Bld: 5.5 % (ref 4.8–5.6)

## 2023-12-01 ENCOUNTER — Other Ambulatory Visit (HOSPITAL_COMMUNITY): Payer: Self-pay

## 2023-12-01 ENCOUNTER — Ambulatory Visit

## 2023-12-01 ENCOUNTER — Telehealth: Payer: Self-pay | Admitting: Pharmacy Technician

## 2023-12-01 VITALS — BP 110/60 | HR 76 | Ht 66.0 in | Wt 333.2 lb

## 2023-12-01 DIAGNOSIS — I5032 Chronic diastolic (congestive) heart failure: Secondary | ICD-10-CM

## 2023-12-01 DIAGNOSIS — I4819 Other persistent atrial fibrillation: Secondary | ICD-10-CM

## 2023-12-01 DIAGNOSIS — K5792 Diverticulitis of intestine, part unspecified, without perforation or abscess without bleeding: Secondary | ICD-10-CM

## 2023-12-01 MED ORDER — EMPAGLIFLOZIN 10 MG PO TABS: 10.0000 mg | ORAL_TABLET | Freq: Every day | ORAL | 1 refills | Status: DC

## 2023-12-01 NOTE — Progress Notes (Signed)
 Cardiology Consultation:    Date:  12/01/2023   ID:  Ricky [name], DOB 02/21/1961, MRN [id number]  PCP:  Ricky Corrente, [name]  Cardiologist:  Ricky Evans Cooper Stamp, MD   Referring MD: Ricky Greenhouse, [name]   No chief complaint on file.    ASSESSMENT AND PLAN:   Ricky [name] 63 year old [name]  Persistent atrial fibrillation, hypertension, morbid obesity, prediabetes, hypertension, hypothyroidism, osteoarthritis of the knee and being evaluated for surgery pending further weight loss, nonobstructive renal stone, diverticulosis, ascending aorta diameter 4 cm by echocardiogram January 2025, chronic diastolic heart failure presumed based on normal biventricular function LVEF 60 to 65% doing well with improved weight and good diuresis on ongoing Lasix  and remains good adherence with salt and fluid restrictions.  Problem List Items Addressed This Visit     Persistent atrial fibrillation (HCC)   CHA2DS2-VASc score 1. Chronic atrial fibrillation.  Irregularly irregular heart rate, presumably in A-fib.  Continues to be adherent with Eliquis  5 mg twice daily. Continue with metoprolol  tartrate 25 mg twice daily. Continue Eliquis  5 mg twice daily. Continue with current pending follow-up with orthopedics and neurology for his pain control.  Will plan further rhythm control options at further follow-up visit tentatively in 6 months.  Presuming weight loss will help.       Chronic diastolic heart failure (HCC) - Primary   Appears euvolemic and compensated. Continue with salt restriction to program per day and fluid restriction to below 2 L/day.  Continue with weight of 333 pounds.  Last month at office follow-up visit he was 341 pounds. Congratulated him about weight loss.  Continue with furosemide  40 mg once daily. Start SGLT2 inhibitors, discussed potential benefits and side effects including urinary tract infection and perineal infections. He is agreeable.  Start Jardiance  10 mg once daily, if  cost not affordable we will check Farxiga 10 mg once daily.        Return to clinic tentatively in 6 months.  History of Present Illness:    Ricky [name] is a 63 y.o. [name] who is being seen today for t follow-up visit. PCP is Ricky Greenhouse, [name]. Last visit with me in the office was 10-23-2023  Persistent atrial fibrillation, hypertension, morbid obesity, prediabetes, hypertension, hypothyroidism, osteoarthritis of the knee and being evaluated for surgery pending further weight loss, nonobstructive renal stone, diverticulosis, ascending aorta diameter 4 cm by echocardiogram January 2025, chronic diastolic heart failure presumed based on normal biventricular function LVEF 60 to 65%.  NT proBNP from 10-31-2023 was elevated 501 Lasix  dose was increased at last office visit. Follow-up labs 11-27-2023 with normal TSH and free T4 CBC remains stable with hemoglobin 12.6 and hematocrit 39.6 Lipid panel with total cholesterol 133, HDL 41, LDL 81, triglycerides 51.  transaminases normal and alkaline phosphatase mildly elevated 147  Mentions overall he is doing well.  Weight has come down by 7 pounds since his last visit.  Today weight 333 pounds.  Feeling better. Continues to have limited mobility due to his knee osteoarthritis but able to do his day-to-day activities.  With the end of the day he feels tired.  Was told recently his testosterone  levels were low and he was wondering if he can go on testosterone  replacement.     Past Medical History:  Diagnosis Date   A-fib [facility])    Acquired hypothyroidism 08/11/2017   Acute on chronic diastolic heart failure (HCC) 10/23/2023   Adjustment disorder with depressed mood 03/22/2018   Allergic rhinitis 08/11/2017  Allergy to antithrombotic medication 08/11/2017   Anxiety    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   Benign prostatic hyperplasia with lower urinary tract symptoms 08/16/2019   Bilateral shoulder pain 07/12/2014   Bipolar disorder,  unspecified (HCC) 07/14/2019   Chronic fatigue 08/11/2017   Chronic pain of left knee 10/30/2017   Last Assessment & Plan:   Advised Aleve BID--2 at a time     Depression    Diverticulitis    Dysthymia 04/26/2019   Formatting of this note might be different from the original.  Trileptal  and Celexa  and doing OK overall     Last Assessment & Plan:   Formatting of this note might be different from the original.  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  Last Assessment & Plan:   Formatting of this note might be different from the original.  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   apixaban   (ELIQUIS ) 5 MG TABS tablet Take 1 tablet (5 mg total) by mouth 2 (two) times daily.   citalopram  (CELEXA ) 20 MG tablet Take 1 tablet (20 mg total) by mouth daily. For mood control   empagliflozin  (JARDIANCE ) 10 MG TABS tablet Take 1 tablet (10 mg total) by mouth daily before breakfast.   fluticasone  (FLONASE ) 50 MCG/ACT nasal spray Place 2 sprays into both nostrils 2 (two) times daily.   furosemide  (LASIX ) 40 MG tablet Take 1 tablet (40 mg total) by mouth daily.   levothyroxine  (SYNTHROID ) 50 MCG tablet Take 1 tablet (50 mcg total) by mouth daily.   OXcarbazepine  (TRILEPTAL ) 150 MG tablet Take 1 tablet (150 mg total) by mouth 2 (two) times daily. For anxiety and mood control     Allergies:   Patient has no known allergies.   Social History   Socioeconomic History   Marital status: Divorced    Spouse name: Not on file   Number of children: 3   Years of education: Not on file   Highest education level: Not on file  Occupational History   Not on file  Tobacco Use   Smoking status: Never    Passive exposure: Never  Smokeless tobacco: Never   Tobacco comments:    Had 11 siblings.   Vaping Use   Vaping status: Never Used  Substance and Sexual Activity   Alcohol use: Not Currently   Drug use: Not Currently    Types: Marijuana   Sexual activity: Not Currently  Other Topics Concern   Not on file  Social History Narrative   Not on file   Social Drivers of Health   Financial Resource Strain: Not on File (11/15/2021)   Received from [employer]    Financial Resource Strain: 0  Food Insecurity: No Food Insecurity (11/24/2023)   Hunger Vital Sign    Worried About Running Out of Food in the Last Year: Never true    Ran Out of Food in the Last Year: Never true  Transportation Needs: No Transportation Needs (11/24/2023)   PRAPARE - Administrator, Civil Service (Medical): No    Lack of Transportation (Non-Medical): No  Physical Activity: Not on File  (11/13/2021)   Received from [facility]   Physical Activity    Physical Activity: 0  Stress: Not on File (11/15/2021)   Received from [facility]   Stress    Stress: 0  Social Connections: Not on File (11/15/2021)   Received from [facility]   Social Connections    Connectedness: 0     Family History: The patient's family history includes Cancer in his father; Diabetes in his mother. There is no history of Hypertension or Heart disease. ROS:   Please see the history of present illness.    All 14 point review of systems negative except as described per history of present illness.  EKGs/Labs/Other Studies Reviewed:    The following studies were reviewed today:   EKG:       Recent Labs: 11/10/2023: BUN 25; Creatinine, Ser 0.83; NT-Pro BNP 501; Potassium 4.1; Sodium 141 11/27/2023: ALT 22; Hemoglobin 12.6; Platelets 203; TSH 1.130  Recent Lipid Panel    Component Value Date/Time   CHOL 133 11/27/2023 0908   TRIG 51 11/27/2023 0908   HDL 41 11/27/2023 0908   CHOLHDL 3.2 11/27/2023 0908   CHOLHDL 5.3 03/24/2018 0625   VLDL 14 03/24/2018 0625   LDLCALC 81 11/27/2023 0908    Physical Exam:    VS:  BP 110/60 (BP Location: Left Arm, Patient Position: Sitting, Cuff Size: Normal)   Pulse 76   Ht 5\' 6"  (1.676 m)   Wt (!) 333 lb 3.2 oz (151.1 kg)   SpO2 98%   BMI 53.78 kg/m     Wt Readings from Last 3 Encounters:  12/01/23 (!) 333 lb 3.2 oz (151.1 kg)  11/24/23 (!) 330 lb 11.2 oz (150 kg)  10/23/23 (!) 341 lb (154.7 kg)     GENERAL:  Well nourished, well developed in no acute distress NECK: No JVD; No carotid bruits CARDIAC: RRR, S1 and S2 present, no murmurs, no rubs, no gallops CHEST:  Clear to auscultation without rales, wheezing or rhonchi  Extremities: No pitting pedal edema. Pulses bilaterally symmetric with radial 2+ and dorsalis pedis 2+ NEUROLOGIC:  Alert and oriented x 3  Medication Adjustments/Labs and Tests Ordered: Current medicines are reviewed at length with the patient  today.  Concerns regarding medicines are outlined above.  No orders of the defined types were placed in this encounter.  Meds ordered this encounter  Medications   empagliflozin  (JARDIANCE ) 10 MG TABS tablet    Sig: Take 1 tablet (10 mg total) by  mouth daily before breakfast.    Dispense:  90 tablet    Refill:  1    Signed, Tyrell Brereton reddy Baptiste Littler, MD, MPH, [facility] 12/01/2023 8:52 AM    [facility]

## 2023-12-01 NOTE — Patient Instructions (Addendum)
 Medication Instructions:  Start Jardiance  10 mg once a day  *If you need a refill on your cardiac medications before your next appointment, please call your pharmacy*   Lab Work: None Ordered If you have labs (blood work) drawn today and your tests are completely normal, you will receive your results only by: MyChart Message (if you have MyChart) OR A paper copy in the mail If you have any lab test that is abnormal or we need to change your treatment, we will call you to review the results.   Testing/Procedures: None Ordered   Follow-Up: At [facility], you and your health needs are our priority.  As part of our continuing mission to provide you with exceptional heart care, we have created designated Provider Care Teams.  These Care Teams include your primary Cardiologist (physician) and Advanced Practice Providers (APPs -  Physician Assistants and Nurse Practitioners) who all work together to provide you with the care you need, when you need it.  We recommend signing up for the patient portal called "MyChart".  Sign up information is provided on this After Visit Summary.  MyChart is used to connect with patients for Virtual Visits (Telemedicine).  Patients are able to view lab/test results, encounter notes, upcoming appointments, etc.  Non-urgent messages can be sent to your provider as well.   To learn more about what you can do with MyChart, go to [email or web address].    Your next appointment:   6 month follow up

## 2023-12-01 NOTE — Assessment & Plan Note (Signed)
 Appears euvolemic and compensated. Continue with salt restriction to program per day and fluid restriction to below 2 L/day.  Continue with weight of 333 pounds.  Last month at office follow-up visit he was 341 pounds. Congratulated him about weight loss.  Continue with furosemide  40 mg once daily. Start SGLT2 inhibitors, discussed potential benefits and side effects including urinary tract infection and perineal infections. He is agreeable.  Start Jardiance  10 mg once daily, if cost not affordable we will check Farxiga 10 mg once daily.

## 2023-12-01 NOTE — Assessment & Plan Note (Signed)
 CHA2DS2-VASc score 1. Chronic atrial fibrillation.  Irregularly irregular heart rate, presumably in A-fib.  Continues to be adherent with Eliquis  5 mg twice daily. Continue with metoprolol  tartrate 25 mg twice daily. Continue Eliquis  5 mg twice daily. Continue with current pending follow-up with orthopedics and neurology for his pain control.  Will plan further rhythm control options at further follow-up visit tentatively in 6 months.  Presuming weight loss will help.

## 2023-12-01 NOTE — Telephone Encounter (Signed)
 Pharmacy Patient Advocate Encounter   Received notification from Physician's Office that prior authorization for Jardiance  is required/requested.   Insurance verification completed.   The patient is insured through [facility] .   Per test claim: PA required; PA submitted to above mentioned insurance via CoverMyMeds Key/confirmation #/EOC [id number] Status is pending

## 2023-12-01 NOTE — Telephone Encounter (Signed)
 Pharmacy Patient Advocate Encounter  Received notification from [facility] that Prior Authorization for Jardiance  has been APPROVED from 12/01/23 to 11/30/24. Ran test claim, Copay is $4.00- 3 months. This test claim was processed through [facility]- copay amounts may vary at other pharmacies due to pharmacy/plan contracts, or as the patient moves through the different stages of their insurance plan.   PA #/Case ID/Reference #: [id number]

## 2023-12-02 ENCOUNTER — Other Ambulatory Visit (HOSPITAL_BASED_OUTPATIENT_CLINIC_OR_DEPARTMENT_OTHER): Payer: Self-pay | Admitting: Student

## 2023-12-02 DIAGNOSIS — E291 Testicular hypofunction: Secondary | ICD-10-CM

## 2023-12-05 ENCOUNTER — Telehealth (HOSPITAL_BASED_OUTPATIENT_CLINIC_OR_DEPARTMENT_OTHER): Payer: Self-pay

## 2023-12-05 ENCOUNTER — Other Ambulatory Visit (HOSPITAL_BASED_OUTPATIENT_CLINIC_OR_DEPARTMENT_OTHER)

## 2023-12-05 DIAGNOSIS — E291 Testicular hypofunction: Secondary | ICD-10-CM

## 2023-12-05 NOTE — Telephone Encounter (Signed)
 Pt said cardiologist did not want him taking testosterone  shots due to risk of blood clots. Is asking if there is something else he can take?

## 2023-12-05 NOTE — Progress Notes (Signed)
 Pt was seen for labs today.

## 2023-12-06 LAB — IRON,TIBC AND FERRITIN PANEL
Ferritin: 420 ng/mL — ABNORMAL HIGH (ref 30–400)
Iron Saturation: 16 % (ref 15–55)
Iron: 44 ug/dL (ref 38–169)
Total Iron Binding Capacity: 282 ug/dL (ref 250–450)
UIBC: 238 ug/dL (ref 111–343)

## 2023-12-06 LAB — LUTEINIZING HORMONE: LH: 20.7 m[IU]/mL — ABNORMAL HIGH (ref 1.7–8.6)

## 2023-12-06 LAB — CORTISOL: Cortisol: 6.5 ug/dL (ref 6.2–19.4)

## 2023-12-06 LAB — FOLLICLE STIMULATING HORMONE: FSH: 33.6 m[IU]/mL — ABNORMAL HIGH (ref 1.5–12.4)

## 2023-12-06 LAB — TESTOSTERONE: Testosterone: 15 ng/dL — ABNORMAL LOW (ref 264–916)

## 2023-12-06 LAB — PROLACTIN: Prolactin: 11.7 ng/mL (ref 3.6–25.2)

## 2023-12-12 LAB — COLOGUARD: COLOGUARD: NEGATIVE

## 2023-12-12 NOTE — Progress Notes (Signed)
 Called and discussed results. Patient is concerned about a testicular lump, he will call Monday to make an appointment. Holding off on T therapy at least until after cardioversion can be achieved.

## 2023-12-13 LAB — COLOGUARD: Cologuard: NEGATIVE

## 2023-12-18 ENCOUNTER — Encounter (HOSPITAL_BASED_OUTPATIENT_CLINIC_OR_DEPARTMENT_OTHER): Payer: Self-pay | Admitting: Student

## 2023-12-18 ENCOUNTER — Ambulatory Visit (HOSPITAL_BASED_OUTPATIENT_CLINIC_OR_DEPARTMENT_OTHER): Admitting: Student

## 2023-12-18 VITALS — BP 108/73 | HR 70 | Temp 97.8°F | Resp 16 | Ht 66.0 in | Wt 327.6 lb

## 2023-12-18 DIAGNOSIS — I10 Essential (primary) hypertension: Secondary | ICD-10-CM

## 2023-12-18 DIAGNOSIS — N5089 Other specified disorders of the male genital organs: Secondary | ICD-10-CM

## 2023-12-18 DIAGNOSIS — J302 Other seasonal allergic rhinitis: Secondary | ICD-10-CM

## 2023-12-18 DIAGNOSIS — E291 Testicular hypofunction: Secondary | ICD-10-CM

## 2023-12-18 DIAGNOSIS — R748 Abnormal levels of other serum enzymes: Secondary | ICD-10-CM

## 2023-12-18 DIAGNOSIS — L2989 Other pruritus: Secondary | ICD-10-CM

## 2023-12-18 HISTORY — DX: Other specified disorders of the male genital organs: N50.89

## 2023-12-18 HISTORY — DX: Abnormal levels of other serum enzymes: R74.8

## 2023-12-18 HISTORY — DX: Other seasonal allergic rhinitis: J30.2

## 2023-12-18 HISTORY — DX: Other pruritus: L29.89

## 2023-12-18 MED ORDER — LEVOCETIRIZINE DIHYDROCHLORIDE 5 MG PO TABS: 5.0000 mg | ORAL_TABLET | Freq: Every evening | ORAL | 3 refills | Status: AC

## 2023-12-18 NOTE — Assessment & Plan Note (Signed)
 Stable-continue current regimen.  Blood pressure is well-controlled after discontinuation of hydrochlorothiazide. Currently managed with metoprolol .

## 2023-12-18 NOTE — Progress Notes (Signed)
 Established Patient Office Visit  Subjective   Patient ID: Ricky [name], male    DOB: [birth year]  Age: 63 y.o. MRN: [id number]  Chief Complaint  Patient presents with   lump on testicles    Pt. C/o a lump on the testicle. Pt. Noticed it about 2 weeks ago. Pt. Denies pain at this time.     HPI  Discussed the use of AI scribe software for clinical note transcription with the patient, who gave verbal consent to proceed.  History of Present Illness   Ricky [name] is a 63 year old male who presents with a testicular mass and seasonal allergies.  He has noticed a testicular mass for about two weeks, which has increased in size over the past few days. The mass has a scab on the outside and causes itching. He maintains good hygiene and has not experienced this issue before. No redness or irritation is present in the testicular area. He has a history of sterility and past hernia surgery involving the right testicle.  He experiences significant seasonal allergies, primarily affecting his throat, which becomes dry and itchy, especially when he steps outside. He has been using an over-the-counter generic medication, likely Benadryl , but finds it insufficient as he needs to take three to four pills. He also uses Flonase  twice daily.  He has a history of atrial fibrillation and hypertension, currently managed with metoprolol  and Eliquis . He was previously on hydrochlorothiazide, which was discontinued, resulting in a stable blood pressure. He reports fewer heart palpitations since starting these medications.  He is actively working on weight loss, having lost one pound recently, and aims to lose 27 more pounds to qualify for knee surgery. His current weight is 331 pounds. He monitors his diet by eating fruits like bananas and apples, oatmeal with raisins, and avoids snacks. He engages in light physical activity such as yard work, limited by knee pain and swelling.  He mentions excessive sweating, which he  attributes to his long-standing history and high water intake. His recent A1c was 5.5, indicating good diabetes control.  He inquires about elevated liver enzymes noted three weeks ago, with no current symptoms or concerns.      Reviewed cardiology note from 12/01/2023.    ROS Per HPI.    Objective:     BP 108/73   Pulse 70   Temp 97.8 F (36.6 C) (Oral)   Resp 16   Ht 5\' 6"  (1.676 m)   Wt (!) 327 lb 9.6 oz (148.6 kg)   SpO2 95%   BMI 52.88 kg/m    Physical Exam Constitutional:      General: He is not in acute distress.    Appearance: Normal appearance. He is not ill-appearing.  HENT:     Head: Normocephalic and atraumatic.     Right Ear: External ear normal.     Left Ear: External ear normal.     Nose: Nose normal.  Eyes:     Conjunctiva/sclera: Conjunctivae normal.  Cardiovascular:     Rate and Rhythm: Normal rate and regular rhythm.     Pulses: Normal pulses.     Heart sounds: Normal heart sounds. No murmur heard.    No friction rub.  Pulmonary:     Effort: Pulmonary effort is normal. No respiratory distress.     Breath sounds: Normal breath sounds. No wheezing, rhonchi or rales.  Genitourinary:    Penis: Normal and uncircumcised.      Epididymis:     Right: Normal.  Comments: L testicle: surgically removed-not present.  R testicle: Seems to be atrophied but without mass or tenderness.  No scabbing or other right-sided lesion noted. Skin:    General: Skin is warm and dry.     Coloration: Skin is not jaundiced or pale.  Neurological:     Mental Status: He is alert.  Psychiatric:        Mood and Affect: Mood normal.        Behavior: Behavior normal.      No results found for any visits on 12/18/23.    The 10-year ASCVD risk score (Arnett DK, et al., 2019) is: 6%    Assessment & Plan:   Seasonal allergies Assessment & Plan: Allergies primarily affecting the throat with dryness and itchiness. Currently using over-the-counter medication,  likely Benadryl , which is not effective. Flonase  is used twice daily. - Prescribe Xyzal to be taken in the evening. - Advise against taking Benadryl  with Xyzal. - If needed, take Xyzal in the morning and Benadryl  at night as needed.  Orders: -     Levocetirizine Dihydrochloride; Take 1 tablet (5 mg total) by mouth every evening.  Dispense: 90 tablet; Refill: 3  Testicular lump Assessment & Plan: Could not palpate lump on the right testicle.  No scabbing present as patient was concerned about.   Elevated alkaline phosphatase level Assessment & Plan: Liver enzymes were elevated three weeks ago. Possible causes include liver issues, bone issues, or fatty liver. Weight loss is the treatment for fatty liver. - Recheck liver enzymes today.  Orders: -     Hepatic function panel  Essential (primary) hypertension Assessment & Plan: Stable-continue current regimen.  Blood pressure is well-controlled after discontinuation of hydrochlorothiazide. Currently managed with metoprolol .   Hypogonadism in male Assessment & Plan: Left-sided testicle absent.  Right-sided testicle seems to be atrophied-consistent with severely low levels of testosterone .  Decision not to proceed with T therapy based on cardiology recommendation-may revisit after cardioversion.   Pruritus of groin in male Assessment & Plan: No current issues related to previous hernia repair. Discussed testicular concerns, but no abnormalities found upon examination. Possible low testosterone  due to past medical history. No need for ultrasound as no concerning findings were noted. - Advise use of jock itch cream for itching. - If itching persists, consider prescribing a cream.       Morbid (severe) obesity due to excess calories (HCC) Assessment & Plan: Stable with weight loss-continue diet and exercise. Weight loss is ongoing with a current weight of 331 pounds. He is 27 pounds away from the target weight for knee surgery.  Engages in yard work and monitors diet, avoiding snacks and large portions.   Return if symptoms worsen or fail to improve.    Handy Mcloud T Casey Fye, [name]

## 2023-12-18 NOTE — Assessment & Plan Note (Signed)
 Liver enzymes were elevated three weeks ago. Possible causes include liver issues, bone issues, or fatty liver. Weight loss is the treatment for fatty liver. - Recheck liver enzymes today.

## 2023-12-18 NOTE — Assessment & Plan Note (Signed)
 Stable with weight loss-continue diet and exercise. Weight loss is ongoing with a current weight of 331 pounds. He is 27 pounds away from the target weight for knee surgery. Engages in yard work and monitors diet, avoiding snacks and large portions.

## 2023-12-18 NOTE — Assessment & Plan Note (Signed)
 Allergies primarily affecting the throat with dryness and itchiness. Currently using over-the-counter medication, likely Benadryl , which is not effective. Flonase  is used twice daily. - Prescribe Xyzal to be taken in the evening. - Advise against taking Benadryl  with Xyzal. - If needed, take Xyzal in the morning and Benadryl  at night as needed.

## 2023-12-18 NOTE — Patient Instructions (Signed)
 It was nice to see you today!  If you have any problems before your next visit feel free to message me via MyChart (minor issues or questions) or call the office, otherwise you may reach out to schedule an office visit.  Thank you! Pau Banh, [name]

## 2023-12-18 NOTE — Assessment & Plan Note (Addendum)
 Left-sided testicle absent.  Right-sided testicle seems to be atrophied-consistent with severely low levels of testosterone .  Decision not to proceed with T therapy based on cardiology recommendation-may revisit after cardioversion.

## 2023-12-18 NOTE — Assessment & Plan Note (Signed)
 No current issues related to previous hernia repair. Discussed testicular concerns, but no abnormalities found upon examination. Possible low testosterone  due to past medical history. No need for ultrasound as no concerning findings were noted. - Advise use of jock itch cream for itching. - If itching persists, consider prescribing a cream.

## 2023-12-18 NOTE — Assessment & Plan Note (Signed)
 Could not palpate lump on the right testicle.  No scabbing present as patient was concerned about.

## 2023-12-19 LAB — HEPATIC FUNCTION PANEL
ALT: 16 IU/L (ref 0–44)
AST: 15 IU/L (ref 0–40)
Albumin: 4.2 g/dL (ref 3.8–4.9)
Alkaline Phosphatase: 164 IU/L — ABNORMAL HIGH (ref 44–121)
Bilirubin Total: 0.4 mg/dL (ref 0.0–1.2)
Bilirubin, Direct: 0.14 mg/dL (ref 0.00–0.40)
Total Protein: 6.3 g/dL (ref 6.0–8.5)

## 2023-12-24 ENCOUNTER — Other Ambulatory Visit (HOSPITAL_BASED_OUTPATIENT_CLINIC_OR_DEPARTMENT_OTHER): Payer: Self-pay

## 2023-12-24 ENCOUNTER — Other Ambulatory Visit (INDEPENDENT_AMBULATORY_CARE_PROVIDER_SITE_OTHER)

## 2023-12-24 DIAGNOSIS — R748 Abnormal levels of other serum enzymes: Secondary | ICD-10-CM

## 2023-12-24 NOTE — Progress Notes (Signed)
 Patient is in office today for a nurse visit for  Labs .

## 2023-12-25 ENCOUNTER — Ambulatory Visit (HOSPITAL_BASED_OUTPATIENT_CLINIC_OR_DEPARTMENT_OTHER): Payer: Self-pay | Admitting: Student

## 2023-12-25 DIAGNOSIS — R748 Abnormal levels of other serum enzymes: Secondary | ICD-10-CM

## 2023-12-25 LAB — GAMMA GT: GGT: 6 IU/L (ref 0–65)

## 2023-12-25 NOTE — Progress Notes (Signed)
 Please Call: He is not having any major fatigue or itching correct? We can either get [facility] of his [facility] with [facility] or If he does not want to do this, our other option would be to recheck his liver again in a couple of months to see how the levels look?  Based on the low levels of his ALP, I believe either option is reasonable.

## 2024-01-01 ENCOUNTER — Ambulatory Visit (HOSPITAL_BASED_OUTPATIENT_CLINIC_OR_DEPARTMENT_OTHER)
Admission: RE | Admit: 2024-01-01 | Discharge: 2024-01-01 | Disposition: A | Discharge status: Home / Self Care | Source: Ambulatory Visit | Attending: Student | Admitting: Student

## 2024-01-01 DIAGNOSIS — R748 Abnormal levels of other serum enzymes: Secondary | ICD-10-CM | POA: Diagnosis not present

## 2024-01-05 ENCOUNTER — Ambulatory Visit (HOSPITAL_BASED_OUTPATIENT_CLINIC_OR_DEPARTMENT_OTHER)
Admission: EM | Admit: 2024-01-05 | Discharge: 2024-01-05 | Disposition: A | Discharge status: Home / Self Care | Attending: Family Medicine | Admitting: Family Medicine

## 2024-01-05 ENCOUNTER — Encounter (HOSPITAL_BASED_OUTPATIENT_CLINIC_OR_DEPARTMENT_OTHER): Payer: Self-pay | Admitting: Emergency Medicine

## 2024-01-05 DIAGNOSIS — J3489 Other specified disorders of nose and nasal sinuses: Secondary | ICD-10-CM

## 2024-01-05 DIAGNOSIS — J0140 Acute pansinusitis, unspecified: Secondary | ICD-10-CM

## 2024-01-05 MED ORDER — DOXYCYCLINE HYCLATE 100 MG PO CAPS: 100.0000 mg | ORAL_CAPSULE | Freq: Two times a day (BID) | ORAL | 0 refills | Status: AC

## 2024-01-05 MED ORDER — TRIAMCINOLONE ACETONIDE 40 MG/ML IJ SUSP
40.0000 mg | Freq: Once | INTRAMUSCULAR | Status: AC
  Administered 2024-01-05: 40 mg via INTRAMUSCULAR

## 2024-01-05 NOTE — ED Triage Notes (Signed)
 Pt c/o facial pain, sinus congestion x 3 days.

## 2024-01-05 NOTE — ED Provider Notes (Signed)
 [facility]    CSN: [id number] Arrival date & time: 01/05/24  3086      History   Chief Complaint Chief Complaint  Patient presents with   Nasal Congestion    HPI Ger Ringenberg is a 63 y.o. male.   Patient reports runny nose and nasal congestion since 01/02/2024.  He has sinus infections about once or twice a year due to allergy season.  He is having facial pressure and pain.  Mostly when he blows his nose it is bloody with some dark yellow at times.  He is on a blood thinner but he is really hurting when he blows his nose.  Denies fever, nausea, vomiting, constipation, diarrhea.     Past Medical History:  Diagnosis Date   A-fib (HCC)    Acquired hypothyroidism 08/11/2017   Acute on chronic diastolic heart failure (HCC) 10/23/2023   Adjustment disorder with depressed mood 03/22/2018   Allergic rhinitis 08/11/2017   Allergy to antithrombotic medication 08/11/2017   Anxiety    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   Benign prostatic hyperplasia with lower urinary tract symptoms 08/16/2019   Bilateral shoulder pain 07/12/2014   Bipolar disorder, unspecified (HCC) 07/14/2019   Chronic fatigue 08/11/2017   Chronic pain of left knee 10/30/2017   Last Assessment & Plan:   Advised Aleve BID--2 at a time     Depression    Diverticulitis    Dysthymia 04/26/2019   Formatting of this note might be different from the original.  Trileptal  and Celexa  and doing OK overall     Last Assessment & Plan:   Formatting of this note might be different from the original.  Sees [facility]     Essential (primary) hypertension 01/05/2021   Hyperlipidemia, unspecified 07/19/2019   Hypogonadism in male 08/11/2017   Last Assessment & Plan:   Cont testosterone  200 every 14d     Hypothyroidism    Impulse control disorder    MDD (major depressive disorder), recurrent episode, severe (HCC) 04/14/2018   Morbid (severe) obesity due to excess calories (HCC) 08/11/2017   Formatting of this note  might be different from the original.  Last Assessment & Plan:   Formatting of this note might be different from the original.  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   Last Assessment & Plan:   Trial Sildenatil 20 2 to 3 1 hr before  $80 for 50     Vasomotor rhinitis 09/02/2017    Patient Active Problem List   Diagnosis Date Noted   Testicular lump 12/18/2023   Seasonal allergies 12/18/2023   Pruritus of groin in male 12/18/2023   Elevated alkaline phosphatase level 12/18/2023   Diverticulitis    Chronic diastolic heart failure (HCC) 10/23/2023   Preoperative cardiovascular examination 09/29/2023   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   Hypothyroidism    Depression    Persistent atrial fibrillation (HCC) 08/21/2023   Anxiety    TBI (traumatic brain injury) (HCC)    Essential (primary) hypertension 01/05/2021  Benign prostatic hyperplasia with lower urinary tract symptoms 08/16/2019   Hyperlipidemia, unspecified 07/19/2019   Bipolar disorder, unspecified (HCC) 07/14/2019   Dysthymia 04/26/2019   MDD (major depressive disorder), recurrent episode, severe (HCC) 04/14/2018   Posttraumatic stress disorder    Impulse control disorder    Severe recurrent major depression without psychotic features (HCC) 03/23/2018   Adjustment disorder with depressed mood 03/22/2018   Chronic pain of left knee 10/30/2017   Vasomotor rhinitis 09/02/2017   Acquired  hypothyroidism 08/11/2017   Allergic rhinitis 08/11/2017   Allergy to antithrombotic medication 08/11/2017   Chronic fatigue 08/11/2017   Hypogonadism in male 08/11/2017   Morbid obesity with BMI of 45.0-49.9, adult (HCC) 08/11/2017   Need for hepatitis C screening test 08/11/2017   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 Authorizing Provider  apixaban  (ELIQUIS ) 5 MG TABS tablet Take 1 tablet (5 mg total) by mouth 2 (two) times daily. 08/21/23  Yes Madireddy, Daymon Evans, MD  citalopram  (CELEXA ) 20 MG tablet Take 1 tablet (20 mg total) by mouth daily. For mood control 04/18/18  Yes Money, Christella Coventry, FNP  doxycycline  (VIBRAMYCIN ) 100 MG capsule Take 1 capsule (100 mg total) by mouth 2 (two) times daily for 10 days. 01/05/24 01/15/24 Yes Guss Legacy, FNP  empagliflozin  (JARDIANCE ) 10 MG TABS tablet Take 1 tablet (10 mg total) by mouth daily before breakfast. 12/01/23  Yes Madireddy, Daymon Evans, MD  metoprolol  tartrate (LOPRESSOR ) 25 MG tablet Take 1 tablet (25 mg total) by mouth 2 (two) times daily. 08/21/23 01/05/24 Yes Madireddy, Daymon Evans, MD  OXcarbazepine  (TRILEPTAL ) 150 MG tablet Take 1 tablet (150 mg total) by mouth 2 (two) times daily. For anxiety and mood control 04/18/18  Yes Money, Christella Coventry, FNP  albuterol  (VENTOLIN  HFA) 108 (90 Base) MCG/ACT inhaler Inhale 2 puffs into the lungs every 12 (twelve) hours.    [provider]  fluticasone  (FLONASE ) 50 MCG/ACT nasal spray Place 2 sprays into both nostrils 2 (two) times daily. 11/24/23   [name], [name], [name]  furosemide   (LASIX ) 40 MG tablet Take 1 tablet (40 mg total) by mouth daily. 10/23/23   Madireddy, Daymon Evans, MD  levocetirizine (XYZAL ) 5 MG tablet Take 1 tablet (5 mg total) by mouth every evening. 12/18/23   [name], [name], [name]  levothyroxine  (SYNTHROID ) 50 MCG tablet Take 1 tablet (50 mcg total) by mouth daily. 11/24/23   [name], Laron Plummer, [name]    Family History Family History  Problem Relation Age of Onset   Diabetes Mother    Cancer Father    Hypertension Neg Hx    Heart disease Neg Hx     Social History Social History   Tobacco Use   Smoking status: Never    Passive exposure: Never   Smokeless tobacco: Never   Tobacco comments:    Had 11 siblings.   Vaping Use   Vaping status: Never Used  Substance Use Topics   Alcohol use: Not Currently   Drug use: Not Currently    Types: Marijuana  Allergies   Patient has no known allergies.   Review of Systems Review of Systems  Constitutional:  Negative for chills and fever.  HENT:  Positive for congestion, nosebleeds, postnasal drip, rhinorrhea, sinus pressure and sinus pain. Negative for ear pain and sore throat.   Eyes:  Negative for pain and visual disturbance.  Respiratory:  Positive for cough.   Cardiovascular:  Negative for chest pain and palpitations.  Gastrointestinal:  Negative for abdominal pain, constipation, diarrhea, nausea and vomiting.  Genitourinary:  Negative for dysuria and hematuria.  Musculoskeletal:  Negative for arthralgias and back pain.  Skin:  Negative for color change and rash.  Neurological:  Negative for seizures and syncope.  All other systems reviewed and are negative.    Physical Exam Triage Vital Signs ED Triage Vitals  Encounter Vitals Group     BP 01/05/24 0852 107/74     Systolic BP Percentile --      Diastolic BP Percentile --      Pulse Rate 01/05/24 0852 81     Resp 01/05/24 0852 18     Temp 01/05/24 0852 97.6 F (36.4 C)     Temp Source 01/05/24 0852 Oral     SpO2 01/05/24  0852 94 %     Weight --      Height --      Head Circumference --      Peak Flow --      Pain Score 01/05/24 0849 0     Pain Loc --      Pain Education --      Exclude from Growth Chart --    No data found.  Updated Vital Signs BP 107/74 (BP Location: Right Arm)   Pulse 81   Temp 97.6 F (36.4 C) (Oral)   Resp 18   SpO2 94%   Visual Acuity Right Eye Distance:   Left Eye Distance:   Bilateral Distance:    Right Eye Near:   Left Eye Near:    Bilateral Near:     Physical Exam Vitals and nursing note reviewed.  Constitutional:      General: He is not in acute distress.    Appearance: He is well-developed. He is obese. He is not ill-appearing or toxic-appearing.  HENT:     Head: Normocephalic and atraumatic.     Right Ear: Hearing, tympanic membrane, ear canal and external ear normal.     Left Ear: Hearing, tympanic membrane, ear canal and external ear normal.     Nose: Mucosal edema, congestion and rhinorrhea present. Rhinorrhea is bloody.     Right Sinus: Maxillary sinus tenderness and frontal sinus tenderness present.     Left Sinus: Maxillary sinus tenderness and frontal sinus tenderness present.     Mouth/Throat:     Lips: Pink.     Mouth: Mucous membranes are moist.     Pharynx: Uvula midline. No oropharyngeal exudate or posterior oropharyngeal erythema.     Tonsils: No tonsillar exudate.  Eyes:     Conjunctiva/sclera: Conjunctivae normal.     Pupils: Pupils are equal, round, and reactive to light.  Cardiovascular:     Rate and Rhythm: Normal rate and regular rhythm.     Heart sounds: S1 normal and S2 normal. No murmur heard. Pulmonary:     Effort: Pulmonary effort is normal. No respiratory distress.     Breath sounds: Normal breath sounds. No decreased breath sounds, wheezing, rhonchi or rales.  Abdominal:     General: Bowel sounds  are normal.     Palpations: Abdomen is soft.     Tenderness: There is no abdominal tenderness.  Musculoskeletal:         General: No swelling.     Cervical back: Neck supple.  Lymphadenopathy:     Head:     Right side of head: Submandibular adenopathy present. No submental, tonsillar, preauricular or posterior auricular adenopathy.     Left side of head: Submandibular adenopathy present. No submental, tonsillar, preauricular or posterior auricular adenopathy.     Cervical: Cervical adenopathy present.     Right cervical: Superficial cervical adenopathy present.     Left cervical: Superficial cervical adenopathy present.  Skin:    General: Skin is warm and dry.     Capillary Refill: Capillary refill takes less than 2 seconds.     Findings: No rash.  Neurological:     Mental Status: He is alert and oriented to person, place, and time.  Psychiatric:        Mood and Affect: Mood normal.      UC Treatments / Results  Labs (all labs ordered are listed, but only abnormal results are displayed) Labs Reviewed - No data to display  EKG   Radiology No results found.  Procedures Procedures (including critical care time)  Medications Ordered in UC Medications  triamcinolone acetonide (KENALOG-40) injection 40 mg (40 mg Intramuscular Given 01/05/24 0909)    Initial Impression / Assessment and Plan / UC Course  I have reviewed the triage vital signs and the nursing notes.  Pertinent labs & imaging results that were available during my care of the patient were reviewed by me and considered in my medical decision making (see chart for details).  Plan of Care: Sinusitis and sinus pain: Encouraged sinus rinses.  Kenalog 40 mg injection now.  Doxycycline  100 mg twice daily for 10 days.  Get fluids and rest.  Follow-up if symptoms do not improve, worsen or new symptoms occur.  I reviewed the plan of care with the patient and/or the patient's guardian.  The patient and/or guardian had time to ask questions and acknowledged that the questions were answered.  I provided instruction on symptoms or reasons to  return here or to go to an ER, if symptoms/condition did not improve, worsened or if new symptoms occurred.  Final Clinical Impressions(s) / UC Diagnoses   Final diagnoses:  Acute non-recurrent pansinusitis  Sinus pain     Discharge Instructions      Sinusitis with sinus pressure and pain: Encouraged sinus rinses.  Kenalog 40 mg injection now.  Doxycycline  100 mg 1 twice daily for 10 days.  Get plenty of fluids and rest.  Follow-up if symptoms do not improve, worsen or new symptoms occur.   ED Prescriptions     Medication Sig Dispense Auth. Provider   doxycycline  (VIBRAMYCIN ) 100 MG capsule Take 1 capsule (100 mg total) by mouth 2 (two) times daily for 10 days. 20 capsule Guss Legacy, FNP      PDMP not reviewed this encounter.   Guss Legacy, FNP 01/05/24 [phone]

## 2024-01-05 NOTE — Discharge Instructions (Addendum)
 Sinusitis with sinus pressure and pain: Encouraged sinus rinses.  Kenalog  40 mg injection now.  Doxycycline  100 mg 1 twice daily for 10 days.  Get plenty of fluids and rest.  Follow-up if symptoms do not improve, worsen or new symptoms occur.

## 2024-01-07 ENCOUNTER — Ambulatory Visit (HOSPITAL_BASED_OUTPATIENT_CLINIC_OR_DEPARTMENT_OTHER): Payer: Self-pay | Admitting: Student

## 2024-01-07 ENCOUNTER — Other Ambulatory Visit (HOSPITAL_BASED_OUTPATIENT_CLINIC_OR_DEPARTMENT_OTHER): Payer: Self-pay | Admitting: Student

## 2024-01-07 DIAGNOSIS — R160 Hepatomegaly, not elsewhere classified: Secondary | ICD-10-CM

## 2024-01-07 DIAGNOSIS — K769 Liver disease, unspecified: Secondary | ICD-10-CM

## 2024-01-07 NOTE — Progress Notes (Signed)
 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

## 2024-01-12 ENCOUNTER — Ambulatory Visit: Payer: Medicaid Other

## 2024-01-21 ENCOUNTER — Encounter (HOSPITAL_BASED_OUTPATIENT_CLINIC_OR_DEPARTMENT_OTHER): Payer: Self-pay

## 2024-01-21 ENCOUNTER — Ambulatory Visit (HOSPITAL_BASED_OUTPATIENT_CLINIC_OR_DEPARTMENT_OTHER)
Admission: EM | Admit: 2024-01-21 | Discharge: 2024-01-21 | Disposition: A | Discharge status: Home / Self Care | Attending: Family Medicine | Admitting: Family Medicine

## 2024-01-21 ENCOUNTER — Ambulatory Visit (HOSPITAL_BASED_OUTPATIENT_CLINIC_OR_DEPARTMENT_OTHER)
Admit: 2024-01-21 | Discharge: 2024-01-21 | Disposition: A | Discharge status: Home / Self Care | Attending: Family Medicine | Admitting: Family Medicine

## 2024-01-21 DIAGNOSIS — M545 Low back pain, unspecified: Secondary | ICD-10-CM

## 2024-01-21 DIAGNOSIS — M549 Dorsalgia, unspecified: Secondary | ICD-10-CM

## 2024-01-21 NOTE — Discharge Instructions (Signed)
 No concerns on your x-rays.  You can take Tylenol  for pain as needed.  Recommend alternating heat and ice Follow-up as needed

## 2024-01-21 NOTE — ED Provider Notes (Signed)
 [name]    CSN: [id number] Arrival date & time: 01/21/24  0805      History   Chief Complaint Chief Complaint  Patient presents with   Back Pain    HPI Ricky [name] is a 63 y.o. male.    Patient is a 63 year old male who presents today with back pain. States while riding lawnmower a few days ago, driving mower up a ramp and the lawnmower flipped backwards and landed on patient. C/O pain to back from shoulders down to low back. Mark to forehead where mower struck patient's face. Denies loss of bowel or bladder control. Denies numbness to legs. No loss of consciousness. Able to ambulate but with pain.   Back Pain   Past Medical History:  Diagnosis Date   A-fib (HCC)    Acquired hypothyroidism 08/11/2017   Acute on chronic diastolic heart failure (HCC) 10/23/2023   Adjustment disorder with depressed mood 03/22/2018   Allergic rhinitis 08/11/2017   Allergy to antithrombotic medication 08/11/2017   Anxiety    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   Benign prostatic hyperplasia with lower urinary tract symptoms 08/16/2019   Bilateral shoulder pain 07/12/2014   Bipolar disorder, unspecified (HCC) 07/14/2019   Chronic fatigue 08/11/2017   Chronic pain of left knee 10/30/2017   Last Assessment & Plan:   Advised Aleve BID--2 at a time     Depression    Diverticulitis    Dysthymia 04/26/2019   Formatting of this note might be different from the original.  Trileptal  and Celexa  and doing OK overall     Last Assessment & Plan:   Formatting of this note might be different from the original.  Sees [employer]     Essential (primary) hypertension 01/05/2021   Hyperlipidemia, unspecified 07/19/2019   Hypogonadism in male 08/11/2017   Last Assessment & Plan:   Cont testosterone  200 every 14d     Hypothyroidism    Impulse control disorder    MDD (major depressive disorder), recurrent episode, severe (HCC) 04/14/2018   Morbid (severe) obesity due to excess calories (HCC)  08/11/2017   Formatting of this note might be different from the original.  Last Assessment & Plan:   Formatting of this note might be different from the original.  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   Last Assessment & Plan:   Trial Sildenatil 20 2 to 3 1 hr before  $80 for 50     Vasomotor rhinitis 09/02/2017    Patient Active Problem List   Diagnosis Date Noted   Testicular lump 12/18/2023   Seasonal allergies 12/18/2023   Pruritus of groin in male 12/18/2023   Elevated alkaline phosphatase level 12/18/2023   Diverticulitis    Chronic diastolic heart failure (HCC) 10/23/2023   Preoperative cardiovascular examination 09/29/2023   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   Hypothyroidism    Depression    Persistent atrial fibrillation (HCC) 08/21/2023   Anxiety    TBI (traumatic brain injury) (HCC)    Essential (primary) hypertension  01/05/2021   Benign prostatic hyperplasia with lower urinary tract symptoms 08/16/2019   Hyperlipidemia, unspecified 07/19/2019   Bipolar disorder, unspecified (HCC) 07/14/2019   Dysthymia 04/26/2019   MDD (major depressive disorder), recurrent episode, severe (HCC) 04/14/2018   Posttraumatic stress disorder    Impulse control disorder    Severe recurrent major depression without psychotic features (HCC) 03/23/2018   Adjustment disorder with depressed mood 03/22/2018   Chronic pain of left knee 10/30/2017    Vasomotor rhinitis 09/02/2017   Acquired hypothyroidism 08/11/2017   Allergic rhinitis 08/11/2017   Allergy to antithrombotic medication 08/11/2017   Chronic fatigue 08/11/2017   Hypogonadism in male 08/11/2017   Morbid obesity with BMI of 45.0-49.9, adult (HCC) 08/11/2017   Need for hepatitis C screening test 08/11/2017   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 Authorizing Provider  albuterol  (VENTOLIN  HFA) 108 (90 Base) MCG/ACT inhaler Inhale 2 puffs into the lungs every 12 (twelve) hours.    [provider]  apixaban  (ELIQUIS ) 5 MG TABS tablet Take 1 tablet (5 mg total) by mouth 2 (two) times daily. 08/21/23   Madireddy, Daymon Evans, MD  citalopram  (CELEXA ) 20 MG tablet Take 1 tablet (20 mg total) by mouth daily. For mood control 04/18/18   Money, Christella Coventry, FNP  empagliflozin  (JARDIANCE ) 10 MG TABS tablet Take 1 tablet (10 mg total) by mouth daily before breakfast. 12/01/23   Madireddy, Daymon Evans, MD  fluticasone  (FLONASE ) 50 MCG/ACT nasal spray Place 2 sprays into both nostrils 2 (two) times daily. 11/24/23   [name], [name], [name]  furosemide  (LASIX ) 40 MG tablet Take 1 tablet (40 mg total) by mouth daily. 10/23/23   Madireddy, Daymon Evans, MD  levocetirizine (XYZAL ) 5 MG tablet Take 1 tablet (5 mg total) by mouth every evening. 12/18/23   [name], [name], [name]  levothyroxine  (SYNTHROID ) 50 MCG tablet Take 1 tablet (50 mcg total) by mouth daily. 11/24/23   [name], [name], [name]  metoprolol  tartrate (LOPRESSOR ) 25 MG tablet Take 1 tablet (25 mg total) by mouth 2  (two) times daily. 08/21/23 01/05/24  Madireddy, Daymon Evans, MD  OXcarbazepine  (TRILEPTAL ) 150 MG tablet Take 1 tablet (150 mg total) by mouth 2 (two) times daily. For anxiety and mood control 04/18/18   Money, Christella Coventry, FNP    Family History Family History  Problem Relation Age of Onset   Diabetes Mother    Cancer Father    Hypertension Neg Hx    Heart disease Neg Hx     Social History Social History   Tobacco Use   Smoking status: Never    Passive exposure: Never   Smokeless tobacco: Never   Tobacco comments:    Had 11 siblings.   Vaping Use   Vaping status: Never Used  Substance Use Topics   Alcohol use: Not Currently   Drug use: Not Currently    Types: Marijuana     Allergies   Patient has no known allergies.   Review of Systems Review of Systems  Musculoskeletal:  Positive for back pain.  See  HPI  Physical Exam Triage Vital Signs ED Triage Vitals  Encounter Vitals Group     BP 01/21/24 0816 129/79     Systolic BP Percentile --      Diastolic BP Percentile --      Pulse Rate 01/21/24 0816 74     Resp 01/21/24 0816 20     Temp 01/21/24 0816 98.8 F (37.1 C)     Temp Source 01/21/24 0816 Oral     SpO2 01/21/24 0816 96 %     Weight --      Height --      Head Circumference --      Peak Flow --      Pain Score 01/21/24 0818 9     Pain Loc --      Pain Education --      Exclude from Growth Chart --    No data found.  Updated Vital Signs BP 129/79 (BP Location: Right Arm)   Pulse 74   Temp 98.8 F (37.1 C) (Oral)   Resp 20   SpO2 96%   Visual Acuity Right Eye Distance:   Left Eye Distance:   Bilateral Distance:    Right Eye Near:   Left Eye Near:    Bilateral Near:     Physical Exam Constitutional:      Appearance: Normal appearance.  Pulmonary:     Effort: Pulmonary effort is normal.  Musculoskeletal:     Thoracic back: No bony tenderness. Decreased range of motion.     Lumbar back: No edema or bony tenderness. Decreased range of  motion.       Back:  Neurological:     Mental Status: He is alert.  Psychiatric:        Mood and Affect: Mood normal.      UC Treatments / Results  Labs (all labs ordered are listed, but only abnormal results are displayed) Labs Reviewed - No data to display  EKG   Radiology DG Thoracic Spine 2 View Result Date: 01/21/2024 CLINICAL DATA:  Back pain EXAM: THORACIC SPINE 2 VIEWS COMPARISON:  None Available. 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 Electronically Signed   By: Fredrich Jefferson M.D.   On: 01/21/2024 09:27   DG Lumbar Spine Complete Result Date: 01/21/2024 CLINICAL DATA:  Back pain EXAM: LUMBAR SPINE - COMPLETE 4+ VIEW COMPARISON:  None Available. FINDINGS: Multilevel degenerative disc disease with narrowing T12-L1, L1-L2 L3-L4, L4-L5 disc spaces with diffuse anterior osteophytes and mild posterior bony spondylosis. There is advanced degenerative facet disease  L3-L4 L4-5 and L5-S1 No spondylolysis or spondylolisthesis. No obvious fractures IMPRESSION: Multilevel degenerative disc disease and facet disease. Electronically Signed   By: Fredrich Jefferson M.D.   On: 01/21/2024 09:26    Procedures Procedures (including critical care time)  Medications Ordered in UC Medications - No data to display  Initial Impression / Assessment and Plan / UC Course  I have reviewed the triage vital signs and the nursing notes.  Pertinent labs & imaging results that were available during my care of the patient were reviewed by me and considered in my medical decision making (see chart for details).     Acute back pain-no specific concerns on exam.  X-rays without any acute concerns.  Just some degenerative changes. Recommendations for Tylenol  for pain as needed and alternate heat and ice to the area gentle  stretching.   Follow-up with primary care as needed Final Clinical Impressions(s) / UC Diagnoses   Final diagnoses:  Acute right-sided low back pain without sciatica     Discharge Instructions      No concerns on your x-rays.  You can take Tylenol  for pain as needed.  Recommend alternating heat and ice Follow-up as needed   ED Prescriptions   None    PDMP not reviewed this encounter.   Landa Pine, FNP 01/21/24 [phone]

## 2024-01-21 NOTE — ED Triage Notes (Signed)
 States while riding lawnmower a few days ago, driving mower up a ramp and the lawnmower flipped backwards and landed on patient. C/O pain to back from shoulders down to low back. Mark to forehead where mower struck patient's face. Denies loss of bowel or bladder control. Denies numbness to legs. No loss of consciousness. Able to ambulate but with pain.

## 2024-01-27 ENCOUNTER — Other Ambulatory Visit (HOSPITAL_BASED_OUTPATIENT_CLINIC_OR_DEPARTMENT_OTHER): Payer: Self-pay | Admitting: Student

## 2024-01-27 DIAGNOSIS — R160 Hepatomegaly, not elsewhere classified: Secondary | ICD-10-CM

## 2024-01-27 DIAGNOSIS — K769 Liver disease, unspecified: Secondary | ICD-10-CM

## 2024-01-29 ENCOUNTER — Other Ambulatory Visit (HOSPITAL_BASED_OUTPATIENT_CLINIC_OR_DEPARTMENT_OTHER): Payer: Self-pay | Admitting: Student

## 2024-01-29 ENCOUNTER — Other Ambulatory Visit (HOSPITAL_BASED_OUTPATIENT_CLINIC_OR_DEPARTMENT_OTHER): Admitting: Radiology

## 2024-01-29 NOTE — Addendum Note (Signed)
 Addended by: Nishi Neiswonger on: 01/29/2024 09:06 AM   Modules accepted: Orders

## 2024-02-23 ENCOUNTER — Ambulatory Visit (HOSPITAL_BASED_OUTPATIENT_CLINIC_OR_DEPARTMENT_OTHER): Admitting: Student

## 2024-02-23 NOTE — Progress Notes (Deleted)
   Established Patient Office Visit  Subjective   Patient ID: Ricky [name], male    DOB: 09/18/1960  Age: 63 y.o. MRN: [id number]  No chief complaint on file.   HPI  Discussed the use of AI scribe software for clinical note transcription with the patient, who gave verbal consent to proceed.  History of Present Illness          I did not get the MRI of his liver. Due for colonoscopy Due for pneumonia vaccine Need updated labs next week, sometime after 02/26/2024.  {History (Optional):23778}  ROS Per HPI.    Objective:     There were no vitals taken for this visit. {Vitals History (Optional):23777}  Physical Exam   No results found for any visits on 02/23/24.  {Labs (Optional):23779}  The ASCVD Risk score (Arnett DK, et al., 2019) failed to calculate for the following reasons:   The valid systolic blood pressure range is 90 to 200 mmHg    Assessment & Plan:   There are no diagnoses linked to this encounter.  Assessment and Plan              No follow-ups on file.    Markis Langland T Dinara Lupu, [name]

## 2024-02-25 ENCOUNTER — Telehealth: Payer: Self-pay

## 2024-02-25 ENCOUNTER — Encounter (HOSPITAL_BASED_OUTPATIENT_CLINIC_OR_DEPARTMENT_OTHER): Payer: Self-pay | Admitting: Student

## 2024-02-25 ENCOUNTER — Ambulatory Visit (HOSPITAL_BASED_OUTPATIENT_CLINIC_OR_DEPARTMENT_OTHER): Admitting: Student

## 2024-02-25 VITALS — BP 98/65 | HR 59 | Temp 98.6°F | Resp 16 | Ht 66.0 in | Wt 326.8 lb

## 2024-02-25 DIAGNOSIS — J309 Allergic rhinitis, unspecified: Secondary | ICD-10-CM

## 2024-02-25 DIAGNOSIS — J4520 Mild intermittent asthma, uncomplicated: Secondary | ICD-10-CM

## 2024-02-25 DIAGNOSIS — I4819 Other persistent atrial fibrillation: Secondary | ICD-10-CM | POA: Diagnosis not present

## 2024-02-25 DIAGNOSIS — Z23 Encounter for immunization: Secondary | ICD-10-CM

## 2024-02-25 DIAGNOSIS — Z599 Problem related to housing and economic circumstances, unspecified: Secondary | ICD-10-CM

## 2024-02-25 DIAGNOSIS — I10 Essential (primary) hypertension: Secondary | ICD-10-CM

## 2024-02-25 DIAGNOSIS — E039 Hypothyroidism, unspecified: Secondary | ICD-10-CM | POA: Diagnosis not present

## 2024-02-25 HISTORY — DX: Mild intermittent asthma, uncomplicated: J45.20

## 2024-02-25 MED ORDER — ALBUTEROL SULFATE HFA 108 (90 BASE) MCG/ACT IN AERS: 2.0000 | INHALATION_SPRAY | Freq: Two times a day (BID) | RESPIRATORY_TRACT | 3 refills | Status: DC

## 2024-02-25 NOTE — Assessment & Plan Note (Signed)
 Chronic, stable. Taking Xyzal  and Flonase  with no issues. Mild throat redness likely allergy-related. - Continue Xyzal  and Flonase  as prescribed.

## 2024-02-25 NOTE — Patient Instructions (Addendum)
 It was nice to see you today!  As we discussed in clinic:  Please call to set up your liver imaging: [facility] on [address] Address: [address] [place], [place], [place] [address] Phone: [phone]  If you have any problems before your next visit feel free to message me via MyChart (minor issues or questions) or call the office, otherwise you may reach out to schedule an office visit.  Thank you! Sandon Yoho, [name]

## 2024-02-25 NOTE — Assessment & Plan Note (Signed)
 Chronic, stable. Slightly soft BP today but without symptoms. No shortness of breath.

## 2024-02-25 NOTE — Assessment & Plan Note (Signed)
 Weight fluctuates between 329 and 325 pounds. Reports feeling lighter on his feet but no significant weight loss achieved. Still not at goal for knee surgery. Still needs to work on diet and exercise- may be effected by financial constraints. VBCi referral sent.

## 2024-02-25 NOTE — Progress Notes (Signed)
   Telephone encounter was:  Unsuccessful.  02/25/2024 Name: Ricky [name] MRN: [id number] DOB: Dec 09, 1960  Unsuccessful outbound call made today to assist with:  Financial Difficulties related to financial strain  Outreach Attempt:  1st Attempt No answer and unable to leave a message    Jon Colt [facility]  [facility] Guide, Phone: [phone] Fax: [phone] Website: [email or web address]

## 2024-02-25 NOTE — Assessment & Plan Note (Signed)
 Chronic, stable. On levothyroxine  (Synthroid ) with no issues related to hair, skin, or nails. Condition is well-managed. - Continue levothyroxine  (Synthroid ) as prescribed.

## 2024-02-25 NOTE — Assessment & Plan Note (Signed)
 On Eliquis  with no bleeding issues. Condition is well-managed. - Continue Eliquis  and metoprolol  as prescribed. - Continue to follow with cardiology

## 2024-02-25 NOTE — Assessment & Plan Note (Signed)
 Experiences chest tightness post-exertion, relieved by albuterol . Suspected underlying asthma, possibly exacerbated by past occupational dust exposure. Oxygen saturation at 94%, slightly low, but no wheezing. Albuterol  effective, but financial constraints limit prescribing a more effective daily inhaler. - Prescribe albuterol  inhaler as needed, up to six times per day. - Consider daily inhaler when financial situation improves.

## 2024-02-25 NOTE — Progress Notes (Signed)
 Established Patient Office Visit  Subjective   Patient ID: Ricky [name], male    DOB: [birth year]  Age: 63 y.o. MRN: [id number]  Chief Complaint  Patient presents with   Medical Management of Chronic Issues    Follow up. Refill needed for albuterol .    HPI  Discussed the use of AI scribe software for clinical note transcription with the patient, who gave verbal consent to proceed.  History of Present Illness   Ricky [name] is a 63 year old male who presents for medication management and disability evaluation.  He is experiencing financial difficulties that impact his ability to afford medications and other necessities. He has quit work and is seeking disability support, with a mobility test and a mental evaluation scheduled as part of his application process.  He experiences persistent fatigue, which he attributes to not having eaten before the visit. No dizziness, falls, or heart palpitations. Blood pressure readings have varied, with a recent reading of 115/unknown.  He has a history of respiratory issues, using albuterol  as needed up to six times per day for chest tightness, especially after physical exertion. Albuterol  helps alleviate his symptoms. No wheezing but feels congested. He also uses Xyzal  at night for allergies, Flonase , and has no issues with levothyroxine  or Eliquis .  He is on Jardiance  10 mg. He has not had an MRI due to financial constraints and transportation issues, despite wanting to confirm the nature of a liver condition.  He recalls having chickenpox as a child and has not had shingles. He mentions a stable weight around 329-325 pounds and has received knee injections from a sports medicine doctor. He feels lighter on his feet but has not noticed significant weight change.  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 He manages his symptoms by cleaning his nose and using albuterol  after exposure to dust.      Patient Active Problem List   Diagnosis Date  Noted   Mild intermittent asthma without complication 02/25/2024   Testicular lump 12/18/2023   Seasonal allergies 12/18/2023   Pruritus of groin in male 12/18/2023   Elevated alkaline phosphatase level 12/18/2023   Diverticulitis    Chronic diastolic heart failure (HCC) 10/23/2023   Preoperative cardiovascular examination 09/29/2023   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   Hypothyroidism    Depression    Persistent atrial fibrillation (HCC) 08/21/2023   Anxiety    TBI (traumatic brain injury) (HCC)    Essential (primary) hypertension 01/05/2021   Benign prostatic hyperplasia with lower urinary tract symptoms 08/16/2019   Hyperlipidemia, unspecified 07/19/2019   Bipolar disorder, unspecified (HCC) 07/14/2019   Dysthymia 04/26/2019   MDD (major depressive disorder), recurrent episode, severe (HCC) 04/14/2018   Posttraumatic stress disorder    Impulse control disorder    Severe recurrent major depression without psychotic features (HCC) 03/23/2018   Adjustment disorder with depressed mood 03/22/2018   Chronic pain of left knee 10/30/2017   Vasomotor rhinitis 09/02/2017   Acquired hypothyroidism 08/11/2017   Allergic rhinitis 08/11/2017   Allergy to antithrombotic medication 08/11/2017   Chronic fatigue 08/11/2017   Hypogonadism in male 08/11/2017   Morbid obesity with BMI of 45.0-49.9, adult (HCC) 08/11/2017   Need for hepatitis C screening test 08/11/2017   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   Morbid (severe) obesity due to excess calories (HCC) 08/11/2017   Bilateral shoulder pain 07/12/2014   Primary osteoarthritis of right knee 07/12/2014   Shoulder impingement syndrome, unspecified laterality 07/12/2014   Past Medical History:  Diagnosis Date   A-fib [facility])    Acquired hypothyroidism 08/11/2017   Acute on chronic diastolic heart failure (HCC) 10/23/2023   Adjustment disorder with depressed mood 03/22/2018   Allergic rhinitis 08/11/2017   Allergy to  antithrombotic medication 08/11/2017   Anxiety    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   Benign prostatic hyperplasia with lower urinary tract symptoms 08/16/2019   Bilateral shoulder pain 07/12/2014   Bipolar disorder, unspecified (HCC) 07/14/2019   Chronic fatigue 08/11/2017   Chronic pain of left knee 10/30/2017   Last Assessment & Plan:   Advised Aleve BID--2 at a time     Depression    Diverticulitis    Dysthymia 04/26/2019   Formatting of this note might be different from the original.  Trileptal  and Celexa  and doing OK overall     Last Assessment & Plan:   Formatting of this note might be different from the original.  Sees [employer]     Essential (primary) hypertension 01/05/2021   Hyperlipidemia, unspecified 07/19/2019   Hypogonadism in male 08/11/2017   Last Assessment & Plan:   Cont testosterone  200 every 14d     Hypothyroidism    Impulse control disorder    MDD (major depressive disorder), recurrent episode, severe (HCC) 04/14/2018   Morbid (severe) obesity due to excess calories (HCC) 08/11/2017   Formatting of this note might be different from the original.  Last Assessment & Plan:   Formatting of this note might be different from the original.  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   Vaping Use   Vaping status: Never Used  Substance Use Topics   Alcohol use: Not Currently   Drug use: Not Currently    Types: Marijuana   No Known Allergies    ROS Per HPI.    Objective:     BP 98/65   Pulse (!) 59   Temp 98.6 F (37 C) (Oral)   Resp 16   Ht 5' 6 (1.676 m)   Wt (!) 326 lb 12.8 oz (148.2 kg)   SpO2 96%   BMI 52.75 kg/m  BP Readings from Last 3 Encounters:  02/25/24 98/65  01/21/24 129/79  01/05/24 107/74   Wt Readings from Last 3 Encounters:  02/25/24 (!) 326 lb 12.8 oz (148.2 kg)  12/18/23 (!) 327 lb 9.6 oz (148.6 kg)  12/01/23 (!) 333 lb 3.2 oz (151.1 kg)      Physical Exam Constitutional:      General: He is not in acute distress.    Appearance: Normal appearance. He is not ill-appearing.  HENT:     Head: Normocephalic and atraumatic.  Right Ear: External ear normal.     Left Ear: External ear normal.     Nose: Nose normal.  Eyes:     Conjunctiva/sclera: Conjunctivae normal.  Cardiovascular:     Rate and Rhythm: Normal rate and regular rhythm.     Heart sounds: Normal heart sounds. No murmur heard.    No friction rub.  Pulmonary:     Effort: Pulmonary effort is normal. No respiratory distress.     Breath sounds: Normal breath sounds. No wheezing, rhonchi or rales.  Skin:    General: Skin is warm and dry.     Coloration: Skin is not jaundiced or pale.  Neurological:     Mental Status: He is alert.  Psychiatric:        Mood and Affect: Mood normal.        Behavior: Behavior normal.      Results for orders placed or performed in visit on 02/25/24  Cologuard  Result Value Ref Range   Cologuard Negative Negative    Last CBC Lab Results  Component Value Date   WBC 6.8 11/27/2023   HGB 12.6 (L) 11/27/2023   HCT 39.6 11/27/2023   MCV 95 11/27/2023   MCH 30.1 11/27/2023   RDW 12.7 11/27/2023   PLT 203  11/27/2023   Last metabolic panel Lab Results  Component Value Date   GLUCOSE 75 11/10/2023   NA 141 11/10/2023   K 4.1 11/10/2023   CL 106 11/10/2023   CO2 23 11/10/2023   BUN 25 11/10/2023   CREATININE 0.83 11/10/2023   EGFR 100 11/10/2023   CALCIUM 8.8 11/10/2023   PROT 6.3 12/18/2023   ALBUMIN 4.2 12/18/2023   BILITOT 0.4 12/18/2023   ALKPHOS 164 (H) 12/18/2023   AST 15 12/18/2023   ALT 16 12/18/2023   ANIONGAP 6 03/21/2018   Last lipids Lab Results  Component Value Date   CHOL 133 11/27/2023   HDL 41 11/27/2023   LDLCALC 81 11/27/2023   TRIG 51 11/27/2023   CHOLHDL 3.2 11/27/2023   Last hemoglobin A1c Lab Results  Component Value Date   HGBA1C 5.5 11/27/2023   Last thyroid functions Lab Results  Component Value Date   TSH 1.130 11/27/2023      The 10-year ASCVD risk score (Arnett DK, et al., 2019) is: 5%    Assessment & Plan:   Mild intermittent asthma without complication Assessment & Plan: Experiences chest tightness post-exertion, relieved by albuterol . Suspected underlying asthma, possibly exacerbated by past occupational dust exposure. Oxygen saturation at 94%, slightly low, but no wheezing. Albuterol  effective, but financial constraints limit prescribing a more effective daily inhaler. - Prescribe albuterol  inhaler as needed, up to six times per day. - Consider daily inhaler when financial situation improves.  Orders: -     Albuterol  Sulfate HFA; Inhale 2 puffs into the lungs every 12 (twelve) hours.  Dispense: 18 g; Refill: 3  Need for financial support -     AMB Referral VBCI Care Management  Need for Streptococcus pneumoniae vaccination -     Pneumococcal conjugate vaccine 20-valent  Persistent atrial fibrillation (HCC) Assessment & Plan: On Eliquis  with no bleeding issues. Condition is well-managed. - Continue Eliquis  and metoprolol  as prescribed. - Continue to follow with cardiology   Essential (primary) hypertension Assessment &  Plan: Chronic, stable. Slightly soft BP today but without symptoms. No shortness of breath.   Acquired hypothyroidism Assessment & Plan: Chronic, stable. On levothyroxine  (Synthroid ) with no issues related to hair, skin, or  nails. Condition is well-managed. - Continue levothyroxine  (Synthroid ) as prescribed.   Allergic rhinitis, unspecified seasonality, unspecified trigger Assessment & Plan: Chronic, stable. Taking Xyzal  and Flonase  with no issues. Mild throat redness likely allergy-related. - Continue Xyzal  and Flonase  as prescribed.   Morbid (severe) obesity due to excess calories (HCC) Assessment & Plan: Weight fluctuates between 329 and 325 pounds. Reports feeling lighter on his feet but no significant weight loss achieved. Still not at goal for knee surgery. Still needs to work on diet and exercise- may be effected by financial constraints. VBCi referral sent.    Follow-up Financial difficulties affect ability to pay for medications and transportation. Applying for disability benefits with upcoming related appointments. Urgent VBCI referral to assist with financial needs. - Submit urgent VBCI referral for financial assistance. - Provide contact information for MRI scheduling in [place]. - Schedule follow-up appointment in three months.      No follow-ups on file.    Marely Apgar T Zaneta [employer], [name]

## 2024-02-26 ENCOUNTER — Telehealth: Payer: Self-pay

## 2024-02-26 NOTE — Progress Notes (Signed)
   Telephone encounter was:  Successful.  Complex Care Management Note Care Guide Note  02/26/2024 Name: Ricky [name] MRN: [id number] DOB: 1960-11-13  Ricky [name] is a 63 y.o. year old male who is a primary care patient of [name], [name], [name] . The community resource team was consulted for assistance with Transportation and Financial Assistance  SDOH screenings and interventions completed:  Yes  Social Drivers of Health From This Encounter   Financial Resource Strain: High Risk (02/26/2024)   Overall Financial Resource Strain (CARDIA)    Difficulty of Paying Living Expenses: Very hard    SDOH Interventions Today    Flowsheet Row Most Recent Value  SDOH Interventions   Financial Strain Interventions Community Resources Provided     Care guide performed the following interventions: Patient provided with information about care guide support team and interviewed to confirm resource needs.Pt is trying to get disablity and living with his brother. Pt is having financial strain and has requested I mail resources for financial and transportation resources for [facility]   Follow Up Plan:  No further follow up planned at this time. The patient has been provided with needed resources.  Encounter Outcome:  Patient Visit Completed    Jon Colt [facility]  [facility] Guide, Phone: [phone] Fax: [phone] Website: [email or web address]

## 2024-04-08 ENCOUNTER — Encounter (HOSPITAL_BASED_OUTPATIENT_CLINIC_OR_DEPARTMENT_OTHER): Payer: Self-pay | Admitting: Emergency Medicine

## 2024-04-08 ENCOUNTER — Other Ambulatory Visit (HOSPITAL_BASED_OUTPATIENT_CLINIC_OR_DEPARTMENT_OTHER): Payer: Self-pay

## 2024-04-08 ENCOUNTER — Ambulatory Visit (HOSPITAL_BASED_OUTPATIENT_CLINIC_OR_DEPARTMENT_OTHER)
Admission: EM | Admit: 2024-04-08 | Discharge: 2024-04-08 | Disposition: A | Discharge status: Home / Self Care | Attending: Family Medicine | Admitting: Family Medicine

## 2024-04-08 DIAGNOSIS — J324 Chronic pansinusitis: Secondary | ICD-10-CM

## 2024-04-08 DIAGNOSIS — R519 Headache, unspecified: Secondary | ICD-10-CM

## 2024-04-08 MED ORDER — CEPHALEXIN 500 MG PO CAPS
500.0000 mg | ORAL_CAPSULE | Freq: Four times a day (QID) | ORAL | 0 refills | Status: AC
  Filled 2024-04-08: qty 28, 7d supply, fill #0

## 2024-04-08 NOTE — Discharge Instructions (Addendum)
 Pansinusitis with facial pain: Encouraged sinus rinses twice daily with saline solution.  Cephalexin  500 mg, 1 pill 4 times daily for 7 days.  Take the cephalexin  with food.  Get plenty fluids and rest.  Patient has a history of a deviated nasal septum and chronic nasal congestion with chronic sinusitis.  Needs to see ENT.  Provided information about Penne Killian, MD (ENT).  Patient encouraged to contact his office and make an appointment for further evaluation of chronic sinusitis.

## 2024-04-08 NOTE — ED Triage Notes (Signed)
 Pt c/o sinus congestion and sinus pressure started yesterday. Pt reports he has yellow nasal drainage.
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
## 2024-05-04 ENCOUNTER — Other Ambulatory Visit: Payer: Self-pay

## 2024-05-04 NOTE — Telephone Encounter (Signed)
 Prescription refill request for Eliquis  received. Indication:afib Last office visit:4/25 Scr:0.83  3/25 Age: 63 Weight:148.2  kg  Prescription refilled

## 2024-05-05 ENCOUNTER — Other Ambulatory Visit (HOSPITAL_BASED_OUTPATIENT_CLINIC_OR_DEPARTMENT_OTHER): Payer: Self-pay

## 2024-05-05 DIAGNOSIS — J300 Vasomotor rhinitis: Secondary | ICD-10-CM

## 2024-05-05 MED ORDER — FLUTICASONE PROPIONATE 50 MCG/ACT NA SUSP: 2.0000 | Freq: Two times a day (BID) | NASAL | 5 refills | Status: AC

## 2024-05-14 ENCOUNTER — Other Ambulatory Visit: Payer: Self-pay

## 2024-05-27 ENCOUNTER — Encounter (HOSPITAL_BASED_OUTPATIENT_CLINIC_OR_DEPARTMENT_OTHER): Payer: Self-pay | Admitting: Student

## 2024-05-27 ENCOUNTER — Ambulatory Visit (HOSPITAL_BASED_OUTPATIENT_CLINIC_OR_DEPARTMENT_OTHER): Admitting: Student

## 2024-05-27 VITALS — BP 105/67 | HR 82 | Temp 97.9°F | Resp 16 | Ht 66.0 in | Wt 338.4 lb

## 2024-05-27 DIAGNOSIS — I4819 Other persistent atrial fibrillation: Secondary | ICD-10-CM | POA: Diagnosis not present

## 2024-05-27 DIAGNOSIS — R160 Hepatomegaly, not elsewhere classified: Secondary | ICD-10-CM

## 2024-05-27 DIAGNOSIS — I5032 Chronic diastolic (congestive) heart failure: Secondary | ICD-10-CM

## 2024-05-27 DIAGNOSIS — J4540 Moderate persistent asthma, uncomplicated: Secondary | ICD-10-CM

## 2024-05-27 DIAGNOSIS — Z23 Encounter for immunization: Secondary | ICD-10-CM

## 2024-05-27 DIAGNOSIS — E039 Hypothyroidism, unspecified: Secondary | ICD-10-CM

## 2024-05-27 DIAGNOSIS — Z6841 Body Mass Index (BMI) 40.0 and over, adult: Secondary | ICD-10-CM

## 2024-05-27 DIAGNOSIS — I10 Essential (primary) hypertension: Secondary | ICD-10-CM

## 2024-05-27 DIAGNOSIS — E785 Hyperlipidemia, unspecified: Secondary | ICD-10-CM

## 2024-05-27 HISTORY — DX: Hepatomegaly, not elsewhere classified: R16.0

## 2024-05-27 LAB — COMPREHENSIVE METABOLIC PANEL WITH GFR
ALT: 20 IU/L (ref 0–44)
AST: 19 IU/L (ref 0–40)
Albumin: 4.1 g/dL (ref 3.8–4.9)
Alkaline Phosphatase: 120 IU/L (ref 47–123)
BUN/Creatinine Ratio: 26 — ABNORMAL HIGH (ref 10–24)
BUN: 24 mg/dL (ref 8–27)
Bilirubin Total: 0.4 mg/dL (ref 0.0–1.2)
CO2: 24 mmol/L (ref 20–29)
Calcium: 8.9 mg/dL (ref 8.6–10.2)
Chloride: 101 mmol/L (ref 96–106)
Creatinine, Ser: 0.91 mg/dL (ref 0.76–1.27)
Globulin, Total: 2 g/dL (ref 1.5–4.5)
Glucose: 71 mg/dL (ref 70–99)
Potassium: 4.4 mmol/L (ref 3.5–5.2)
Sodium: 140 mmol/L (ref 134–144)
Total Protein: 6.1 g/dL (ref 6.0–8.5)
eGFR: 96 mL/min/1.73 (ref >59)

## 2024-05-27 LAB — CBC WITH DIFFERENTIAL/PLATELET
Basophils Absolute: 0 x10E3/uL (ref 0.0–0.2)
Basos: 0 %
EOS (ABSOLUTE): 0.1 x10E3/uL (ref 0.0–0.4)
Eos: 1 %
Hematocrit: 43.5 % (ref 37.5–51.0)
Hemoglobin: 14 g/dL (ref 13.0–17.7)
Immature Grans (Abs): 0 x10E3/uL (ref 0.0–0.1)
Immature Granulocytes: 0 %
Lymphocytes Absolute: 1.5 x10E3/uL (ref 0.7–3.1)
Lymphs: 15 %
MCH: 30.2 pg (ref 26.6–33.0)
MCHC: 32.2 g/dL (ref 31.5–35.7)
MCV: 94 fL (ref 79–97)
Monocytes Absolute: 0.7 x10E3/uL (ref 0.1–0.9)
Monocytes: 7 %
Neutrophils Absolute: 7.7 x10E3/uL — ABNORMAL HIGH (ref 1.4–7.0)
Neutrophils: 77 %
Platelets: 266 x10E3/uL (ref 150–450)
RBC: 4.64 x10E6/uL (ref 4.14–5.80)
RDW: 12.9 % (ref 11.6–15.4)
WBC: 10.1 x10E3/uL (ref 3.4–10.8)

## 2024-05-27 LAB — LIPID PANEL
Chol/HDL Ratio: 3.5 ratio (ref 0.0–5.0)
Cholesterol, Total: 159 mg/dL (ref 100–199)
HDL: 46 mg/dL (ref >39)
LDL Chol Calc (NIH): 99 mg/dL (ref 0–99)
Triglycerides: 73 mg/dL (ref 0–149)
VLDL Cholesterol Cal: 14 mg/dL (ref 5–40)

## 2024-05-27 MED ORDER — MOMETASONE FURO-FORMOTEROL FUM 200-5 MCG/ACT IN AERO: 2.0000 | INHALATION_SPRAY | Freq: Two times a day (BID) | RESPIRATORY_TRACT | 5 refills | Status: AC

## 2024-05-27 NOTE — Progress Notes (Signed)
 Established Patient Office Visit  Subjective   Patient ID: Ricky [name], male    DOB: [birth year]  Age: 63 y.o. MRN: [id number]  Chief Complaint  Patient presents with   Medical Management of Chronic Issues    Follow up. Would like shingrix today.      HPI  Discussed the use of AI scribe software for clinical note transcription with the patient, who gave verbal consent to proceed.  History of Present Illness   Ricky [name] is a 63 year old male with atrial fibrillation and asthma who presents for follow-up and management of his chronic conditions.  He takes metoprolol  and Eliquis  for atrial fibrillation and reports no bleeding issues. He feels well overall since his last visit.  He experiences knee and back pain due to arthritis and has received cortisone injections. He recently received another cortisone injection in his knee. He has been gaining weight despite maintaining a healthy diet and regular physical activity. His diet includes oatmeal and bananas for breakfast, vegetables for lunch, and baked chicken with vegetables for dinner, with occasional pizza once a week.  He has not completed the MRI of his liver with and without contrast, ordered four months ago, due to transportation and financial issues. He is interested in having the imaging done at a more convenient location.  He uses an albuterol  inhaler for asthma, typically in the morning and as needed for chest tightness, reporting daily use and sometimes at night. He also uses saline spray for nasal congestion, attributed to allergies, particularly in the fall. He takes Xyzal  at night and sometimes during the day, which he believes may contribute to a dry, itchy cough. He has been evaluated by an ENT specialist, who recommended a CT scan, but his insurance denied the request.  He reports feeling better regarding his depression and does not feel that he has significant fluid retention in his legs. He maintains an active lifestyle,  doing about half a day's work and taking naps as needed. 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           05/27/2024    8:04 AM 02/25/2024    8:14 AM 11/24/2023    8:53 AM  PHQ9 SCORE ONLY  PHQ-9 Total Score 4 9  1       Data saved with a previous flowsheet row definition      05/27/2024    8:05 AM 02/25/2024    8:16 AM 11/24/2023    8:54 AM  GAD 7 : Generalized Anxiety Score  Nervous, Anxious, on Edge 0 1 0  Control/stop worrying 0 1 0  Worry too much - different things 0 1 0  Trouble relaxing 1 3 1   Restless 0 1 1  Easily annoyed or irritable 0 0 0  Afraid - awful might happen 0 1 0  Total GAD 7 Score 1 8 2   Anxiety Difficulty Not difficult at all Not difficult at all Very difficult    Patient Active Problem List   Diagnosis Date Noted   Mass of right lobe of liver 05/27/2024   Mild intermittent asthma without complication 02/25/2024   Testicular lump 12/18/2023   Seasonal allergies 12/18/2023   Pruritus of groin in male 12/18/2023   Elevated alkaline phosphatase level 12/18/2023   Diverticulitis    Chronic diastolic heart failure (HCC) 10/23/2023   Preoperative cardiovascular examination 09/29/2023   Ascending aorta dilatation 09/29/2023   Hypothyroidism  Depression    Persistent atrial fibrillation (HCC) 08/21/2023   Anxiety    TBI (traumatic brain injury) (HCC)    Essential (primary) hypertension 01/05/2021   Benign prostatic hyperplasia with lower urinary tract symptoms 08/16/2019   Hyperlipidemia, unspecified 07/19/2019   Bipolar disorder, unspecified (HCC) 07/14/2019   Dysthymia 04/26/2019   MDD (major depressive disorder), recurrent episode, severe (HCC) 04/14/2018   Posttraumatic stress disorder    Impulse control disorder    Severe recurrent major depression without psychotic features (HCC) 03/23/2018   Adjustment disorder with depressed mood 03/22/2018   Chronic pain of left knee  10/30/2017   Vasomotor rhinitis 09/02/2017   Acquired hypothyroidism 08/11/2017   Allergic rhinitis 08/11/2017   Allergy to antithrombotic medication 08/11/2017   Chronic fatigue 08/11/2017   Hypogonadism in male 08/11/2017   BMI 50.0-59.9, adult (HCC) 08/11/2017   Need for hepatitis C screening test 08/11/2017   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   Morbid (severe) obesity due to excess calories (HCC) 08/11/2017   Bilateral shoulder pain 07/12/2014   Primary osteoarthritis of right knee 07/12/2014   Shoulder impingement syndrome, unspecified laterality 07/12/2014   Past Medical History:  Diagnosis Date   A-fib (HCC)    Acquired hypothyroidism 08/11/2017   Acute on chronic diastolic heart failure (HCC) 10/23/2023   Adjustment disorder with depressed mood 03/22/2018   Allergic rhinitis 08/11/2017   Allergy to antithrombotic medication 08/11/2017   Anxiety    Ascending aorta dilatation 09/29/2023   Benign prostatic hyperplasia with lower urinary tract symptoms 08/16/2019   Bilateral shoulder pain 07/12/2014   Bipolar disorder, unspecified (HCC) 07/14/2019   Chronic fatigue 08/11/2017   Chronic pain of left knee 10/30/2017   Last Assessment & Plan:   Advised Aleve BID--2 at a time     Depression    Diverticulitis    Dysthymia 04/26/2019   Formatting of this note might be different from the original.  Trileptal  and Celexa  and doing OK overall     Last Assessment & Plan:   Formatting of this note might be different from the original.  Sees [employer]     Essential (primary) hypertension 01/05/2021   Hyperlipidemia, unspecified 07/19/2019   Hypogonadism in male 08/11/2017   Last Assessment & Plan:   Cont testosterone  200 every 14d     Hypothyroidism    Impulse control disorder    MDD (major depressive disorder), recurrent episode, severe (HCC) 04/14/2018   Morbid (severe) obesity due to excess calories (HCC) 08/11/2017   Formatting of this note might be different from the  original.  Last Assessment & Plan:   Formatting of this note might be different from the original.  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    Objective:     BP 105/67   Pulse 82   Temp 97.9 F (36.6 C) (Oral)   Resp 16   Ht 5' 6 (1.676 m)   Wt (!) 338 lb 6.4 oz (153.5 kg)   SpO2 97%   BMI 54.62 kg/m  BP Readings from Last 3 Encounters:  05/27/24 105/67  04/08/24 115/77  02/25/24 98/65   Wt Readings from Last 3 Encounters:  05/27/24 (!) 338 lb 6.4 oz (153.5 kg)  02/25/24 (!) 326 lb 12.8 oz (148.2 kg)  12/18/23 (!) 327 lb 9.6 oz (148.6 kg)      Physical Exam Constitutional:      General: He is not in acute distress.    Appearance: Normal appearance. He is obese. He is not ill-appearing.  HENT:     Head: Normocephalic and atraumatic.     Right Ear: External ear normal.     Left Ear: External ear normal.     Nose: Nose normal.  Eyes:     Conjunctiva/sclera: Conjunctivae normal.  Cardiovascular:     Rate and Rhythm: Normal rate and regular  rhythm.     Pulses: Normal pulses.     Heart sounds: Normal heart sounds. No murmur heard.    No friction rub.  Pulmonary:     Effort: Pulmonary effort is normal. No respiratory distress.     Breath sounds: Normal breath sounds. No wheezing, rhonchi or rales.  Musculoskeletal:     Right lower leg: No edema.     Left lower leg: No edema.  Skin:    General: Skin is warm and dry.     Coloration: Skin is not jaundiced or pale.  Neurological:     Mental Status: He is alert.  Psychiatric:        Mood and Affect: Mood normal.        Behavior: Behavior normal.      No results found for any visits on 05/27/24.  Last CBC Lab Results  Component Value Date   WBC 6.8 11/27/2023   HGB 12.6 (L) 11/27/2023   HCT 39.6 11/27/2023   MCV 95 11/27/2023   MCH 30.1 11/27/2023   RDW 12.7 11/27/2023   PLT 203 11/27/2023   Last metabolic panel Lab Results  Component Value Date   GLUCOSE 75 11/10/2023   NA 141 11/10/2023   K 4.1 11/10/2023   CL 106 11/10/2023   CO2 23 11/10/2023   BUN 25 11/10/2023   CREATININE 0.83 11/10/2023   EGFR 100 11/10/2023   CALCIUM 8.8 11/10/2023   PROT 6.3 12/18/2023   ALBUMIN 4.2 12/18/2023   BILITOT 0.4 12/18/2023   ALKPHOS 164 (H) 12/18/2023   AST 15 12/18/2023   ALT 16 12/18/2023   ANIONGAP 6 03/21/2018   Last lipids Lab Results  Component Value Date   CHOL 133 11/27/2023   HDL 41 11/27/2023   LDLCALC 81 11/27/2023   TRIG 51 11/27/2023   CHOLHDL 3.2 11/27/2023   Last hemoglobin A1c Lab Results  Component Value Date   HGBA1C 5.5 11/27/2023      The 10-year ASCVD risk score (Arnett DK, et al., 2019) is: 5.7%    Assessment & Plan:   Assessment and Plan    Atrial fibrillation Chronic, stable. Atrial fibrillation is  well-managed with current medications. No episodes of bleeding reported. - Continue metoprolol  - Continue Eliquis   Liver lesion - MRI needed Liver lesion requires further imaging. Previous MRI was not completed due to  logistical issues. - Reorder MRI of liver with and without contrast at local facility  Obesity, BMI 50.0-59.9 Weight has increased despite a reportedly healthy diet and regular physical activity. No previous consultation with a nutritionist. - Refer to a nutritionist for dietary counseling  Asthma Chronic, stable. Asthma symptoms managed with albuterol  inhaler. Symptoms include chest tightness and are possibly exacerbated by nasal congestion and drainage. - Prescribe Dulera inhaler, two puffs twice daily - Instruct to rinse mouth after using Dulera - Continue saline nasal spray as needed  Allergic rhinitis Chronic, stable. Allergic rhinitis with symptoms of dry, itchy cough and nasal congestion. Symptoms may be exacerbated by environmental allergens such as leaf mold. - Continue Xyzal  at night - Use saline nasal spray as needed  Chronic Diastolic HF Euvolemic on exam today. Stable. - Continue to monitor. - Continue current regimen. - Continue to follow with cardiology  General Health Maintenance General health maintenance is up to date except for shingles vaccination. - Administer shingles vaccine today      Return in about 6 months (around 11/25/2024) for Chronic Followup.    Novalyn Lajara T Maudine Kluesner, [name]

## 2024-05-27 NOTE — Patient Instructions (Signed)
 It was nice to see you today!  If you have any problems before your next visit feel free to message me via MyChart (minor issues or questions) or call the office, otherwise you may reach out to schedule an office visit.  Thank you! Pau Banh, [name]

## 2024-05-28 ENCOUNTER — Ambulatory Visit (HOSPITAL_BASED_OUTPATIENT_CLINIC_OR_DEPARTMENT_OTHER): Payer: Self-pay | Admitting: Student

## 2024-06-10 DIAGNOSIS — I4891 Unspecified atrial fibrillation: Secondary | ICD-10-CM | POA: Insufficient documentation

## 2024-06-11 ENCOUNTER — Ambulatory Visit

## 2024-06-14 ENCOUNTER — Ambulatory Visit

## 2024-06-14 VITALS — BP 114/72 | HR 76 | Ht 72.0 in | Wt 337.0 lb

## 2024-06-14 DIAGNOSIS — I5032 Chronic diastolic (congestive) heart failure: Secondary | ICD-10-CM | POA: Insufficient documentation

## 2024-06-14 DIAGNOSIS — I77810 Thoracic aortic ectasia: Secondary | ICD-10-CM | POA: Diagnosis not present

## 2024-06-14 DIAGNOSIS — I4819 Other persistent atrial fibrillation: Secondary | ICD-10-CM | POA: Insufficient documentation

## 2024-06-14 MED ORDER — FUROSEMIDE 40 MG PO TABS: 40.0000 mg | ORAL_TABLET | Freq: Every day | ORAL | 3 refills | Status: AC

## 2024-06-14 NOTE — Assessment & Plan Note (Addendum)
 CHA2DS2-VASc score 1. Longstanding history of A-fib.  Rates are well-controlled. Continue Eliquis  5 mg twice daily. Continue metoprolol  tartrate 25 mg twice daily.  Will consider rhythm control with antiarrhythmics if he is able to successfully achieve more weight loss

## 2024-06-14 NOTE — Progress Notes (Signed)
 Cardiology Consultation:    Date:  06/14/2024   ID:  Ricky [name], DOB 1961/03/16, MRN [id number]  PCP:  Ricky Lang DASEN, [name]  Cardiologist:  Ricky SAUNDERS Esparanza Krider, MD   Referring MD: Ricky Lang DASEN, [name]   No chief complaint on file.    ASSESSMENT AND PLAN:   [name] 63 year old male with history of persistent atrial fibrillation,  chronic diastolic heart failure presumed based on normal biventricular function LVEF 60 to 65%, hypertension, morbid obesity, prediabetes, hypertension, hypothyroidism, osteoarthritis of the knee and being evaluated for surgery pending further weight loss, nonobstructive renal stone, diverticulosis, ascending aorta diameter 4 cm by echocardiogram January 2025   Problem List Items Addressed This Visit     Persistent atrial fibrillation (HCC)   CHA2DS2-VASc score 1. Longstanding history of A-fib.  Rates are well-controlled. Continue Eliquis  5 mg twice daily. Continue metoprolol  tartrate 25 mg twice daily.  Will consider rhythm control with antiarrhythmics if he is able to successfully achieve more weight loss      Ascending aorta dilatation   Mildly dilated ascending aorta 4 cm on echocardiogram January 2025.  Likely upper limits of normal considering his body surface area.  Will follow-up with repeat imaging CT chest without contrast tentatively in 6 months after his next follow-up visit in the office.      Chronic diastolic heart failure (HCC) - Primary   Appears compensated.  Euvolemic. However weight has gone up to 337 pounds in comparison to 333 pounds at last visit.  Continue furosemide  40 mg once daily with an additional dose this afternoon and tomorrow afternoon.  Continue Jardiance  10 mg once daily.  Tolerating well no side effects. Continue metoprolol  titrate 25 mg twice daily      Return to clinic tentatively in 6 months. Will obtain imaging with CT chest without contrast after subsequent follow-up visit to assess interval  change and ascending aorta dimensions.   History of Present Illness:    Ricky [name] is a 63 y.o. male who is being seen today for follow-up visit. PCP is [name], [name], [name]. Last visit with me in the office was 12/01/2023.  Pleasant man here for the visit by himself.  Lives at home with his brother.  Stays active.  His activities at home such as working on the car, this season he is already splitting wood for heating this winter.  Persistent atrial fibrillation, hypertension, morbid obesity, prediabetes, hypertension, hypothyroidism, osteoarthritis of the knee and being evaluated for surgery pending further weight loss, nonobstructive renal stone, diverticulosis, ascending aorta diameter 4 cm by echocardiogram January 2025, chronic diastolic heart failure presumed based on normal biventricular function LVEF 60 to 65%   No chest pain or shortness of breath. Denies any pedal edema. No orthopnea or paroxysmal nocturnal dyspnea. Unable to weigh himself at home. Weight here at the office 337 pounds 4 pounds heavier than last visit. Denies any palpitations, lightheadedness, dizziness or syncopal episodes.  No blood in urine or stools.  Good adherence with his medications. Diet at times he consumes baked ham once a week and once a week he has a slice of pizza.  Recent blood work from 05/27/2024 total cholesterol 159, triglycerides 73, HDL 46, LDL 99. CMP with BUN 24, creatinine 0.91, eGFR 96 Normal transaminases and alkaline phosphatase CBC with normal hemoglobin 14, hematocrit 43.5, WBC 10.1, platelets 266.  Past Medical History:  Diagnosis Date   A-fib [facility])    Acquired hypothyroidism 08/11/2017   Acute on chronic diastolic heart  failure (HCC) 10/23/2023   Adjustment disorder with depressed mood 03/22/2018   Allergic rhinitis 08/11/2017   Allergy to antithrombotic medication 08/11/2017   Anxiety    Ascending aorta dilatation 09/29/2023   Benign prostatic hyperplasia with lower  urinary tract symptoms 08/16/2019   Bilateral shoulder pain 07/12/2014   Bipolar disorder, unspecified (HCC) 07/14/2019   BMI 50.0-59.9, adult (HCC) 08/11/2017   Chronic diastolic heart failure (HCC) 10/23/2023   Chronic fatigue 08/11/2017   Chronic pain of left knee 10/30/2017   Last Assessment & Plan:   Advised Aleve BID--2 at a time     Depression    Diverticulitis    Dysthymia 04/26/2019   Formatting of this note might be different from the original.  Trileptal  and Celexa  and doing OK overall     Last Assessment & Plan:   Formatting of this note might be different from the original.  Sees [employer]     Elevated alkaline phosphatase level 12/18/2023   Essential (primary) hypertension 01/05/2021   Hyperlipidemia, unspecified 07/19/2019   Hypogonadism in male 08/11/2017   Last Assessment & Plan:   Cont testosterone  200 every 14d     Hypothyroidism    Impulse control disorder    Mass of right lobe of liver 05/27/2024   MDD (major depressive disorder), recurrent episode, severe (HCC) 04/14/2018   Mild intermittent asthma without complication 02/25/2024   Morbid (severe) obesity due to excess calories (HCC) 08/11/2017   Formatting of this note might be different from the original.  Last Assessment & Plan:   Formatting of this note might be different from the original.  Needs to work on this--never been small     Morbid obesity with BMI of 45.0-49.9, adult (HCC) 08/11/2017   Need for hepatitis C screening test 08/11/2017   Persistent atrial fibrillation (HCC) 08/21/2023   Posttraumatic stress disorder    Preoperative cardiovascular examination 09/29/2023   Primary osteoarthritis of right knee 07/12/2014   Pruritus of groin in male 12/18/2023   Seasonal allergies 12/18/2023   Severe recurrent major depression without psychotic features (HCC) 03/23/2018   Shoulder impingement syndrome, unspecified laterality 07/12/2014   TBI (traumatic [facility] injury) [facility])    Testicular lump 12/18/2023    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   citalopram  (CELEXA ) 20 MG tablet Take 1 tablet (20 mg total) by mouth daily. For mood control   clobetasol cream (TEMOVATE) 0.05 % Apply 1 Application topically as needed.   ELIQUIS  5 MG TABS tablet Take 1 tablet (5 mg total) by mouth 2 (two) times daily.   fluticasone  (FLONASE ) 50 MCG/ACT nasal spray Place 2 sprays into both nostrils 2 (two) times daily.   JARDIANCE  10 MG TABS tablet TAKE ONE TABLET (10MG ) BY MOUTH EVERY DAY BEFORE BREAKFAST.   levocetirizine (XYZAL ) 5 MG tablet Take 1 tablet (5 mg total) by mouth every evening.   levothyroxine  (SYNTHROID ) 50 MCG tablet Take 1 tablet (50 mcg total) by mouth daily.   metoprolol  tartrate (LOPRESSOR ) 25 MG tablet Take 1 tablet (25 mg total) by mouth  2 (two) times daily.   mometasone-formoterol (DULERA) 200-5 MCG/ACT AERO Inhale 2 puffs into the lungs 2 (two) times daily.   OXcarbazepine  (TRILEPTAL ) 150 MG tablet Take 1 tablet (150 mg total) by mouth 2 (two) times daily. For anxiety and mood control   [DISCONTINUED] furosemide  (LASIX ) 40 MG tablet Take 1 tablet (40 mg total) by mouth daily.     Allergies:   Patient has no known allergies.   Social History   Socioeconomic History   Marital status: Divorced    Spouse name: Not on file   Number of children: 3   Years of education: Not on file   Highest education level: Not on file  Occupational History   Not on file  Tobacco Use   Smoking status: Never    Passive exposure: Never   Smokeless tobacco: Never  Vaping Use   Vaping status: Never Used  Substance and Sexual  Activity   Alcohol use: Not Currently   Drug use: Not Currently    Types: Marijuana   Sexual activity: Not Currently  Other Topics Concern   Not on file  Social History Narrative   Had 11 siblings.        3 children   Social Drivers of Corporate Investment Banker Strain: High Risk (02/26/2024)   Overall Financial Resource Strain (CARDIA)    Difficulty of Paying Living Expenses: Very hard  Food Insecurity: No Food Insecurity (11/24/2023)   Hunger Vital Sign    Worried About Running Out of Food in the Last Year: Never true    Ran Out of Food in the Last Year: Never true  Transportation Needs: No Transportation Needs (11/24/2023)   PRAPARE - Administrator, Civil Service (Medical): No    Lack of Transportation (Non-Medical): No  Physical Activity: Not on File (11/13/2021)   Received from [facility]   Physical Activity    Physical Activity: 0  Stress: Not on File (11/15/2021)   Received from [facility]   Stress    Stress: 0  Social Connections: Not on File (11/15/2021)   Received from [facility] [place]   Social Connections    Connectedness: 0     Family History: The patient's family history includes Cancer in his father; Diabetes in his mother. There is no history of Hypertension or Heart disease. ROS:   Please see the history of present illness.    All 14 point review of systems negative except as described per history of present illness.  EKGs/Labs/Other Studies Reviewed:    The following studies were reviewed today:   EKG:       Recent Labs: 11/10/2023: NT-Pro BNP 501 11/27/2023: TSH 1.130 05/27/2024: ALT 20; BUN 24; Creatinine, Ser 0.91; Hemoglobin 14.0; Platelets 266; Potassium 4.4; Sodium 140  Recent Lipid Panel    Component Value Date/Time   CHOL 159 05/27/2024 0852   TRIG 73 05/27/2024 0852   HDL 46 05/27/2024 0852   CHOLHDL 3.5 05/27/2024 0852   CHOLHDL 5.3 03/24/2018 0625   VLDL 14 03/24/2018 0625   LDLCALC 99 05/27/2024 0852    Physical Exam:    VS:  BP 114/72    Pulse 76   Ht 6' (1.829 m)   Wt (!) 337 lb (152.9 kg)   SpO2 99%   BMI 45.71 kg/m     Wt Readings from Last 3 Encounters:  06/14/24 (!) 337 lb (152.9 kg)  05/27/24 (!) 338 lb 6.4 oz (153.5 kg)  02/25/24 (!) 326 lb 12.8 oz (148.2 kg)  GENERAL:  Well nourished, well developed in no acute distress NECK: No JVD; No carotid bruits CARDIAC: RRR, S1 and S2 present, no murmurs, no rubs, no gallops CHEST:  Clear to auscultation without rales, wheezing or rhonchi  Extremities: No pitting pedal edema. Pulses bilaterally symmetric with radial 2+ and dorsalis pedis 2+ NEUROLOGIC:  Alert and oriented x 3  Medication Adjustments/Labs and Tests Ordered: Current medicines are reviewed at length with the patient today.  Concerns regarding medicines are outlined above.  No orders of the defined types were placed in this encounter.  No orders of the defined types were placed in this encounter.   Signed, Savino Whisenant reddy Eleshia Wooley, MD, MPH, [facility]. 06/14/2024 11:31 AM    [facility]

## 2024-06-14 NOTE — Assessment & Plan Note (Signed)
 Appears compensated.  Euvolemic. However weight has gone up to 337 pounds in comparison to 333 pounds at last visit.  Continue furosemide  40 mg once daily with an additional dose this afternoon and tomorrow afternoon.  Continue Jardiance  10 mg once daily.  Tolerating well no side effects. Continue metoprolol  titrate 25 mg twice daily

## 2024-06-14 NOTE — Assessment & Plan Note (Signed)
 Mildly dilated ascending aorta 4 cm on echocardiogram January 2025.  Likely upper limits of normal considering his body surface area.  Will follow-up with repeat imaging CT chest without contrast tentatively in 6 months after his next follow-up visit in the office.

## 2024-06-14 NOTE — Patient Instructions (Signed)
 Medication Instructions:  Your physician has recommended you make the following change in your medication:   START: Furosemide  40 mg daily (Take an additional 40 mg today and tomorrow).  *If you need a refill on your cardiac medications before your next appointment, please call your pharmacy*  Lab Work: None If you have labs (blood work) drawn today and your tests are completely normal, you will receive your results only by: MyChart Message (if you have MyChart) OR A paper copy in the mail If you have any lab test that is abnormal or we need to change your treatment, we will call you to review the results.  Testing/Procedures: None  Follow-Up: At [facility], you and your health needs are our priority.  As part of our continuing mission to provide you with exceptional heart care, our providers are all part of one team.  This team includes your primary Cardiologist (physician) and Advanced Practice Providers or APPs (Physician Assistants and Nurse Practitioners) who all work together to provide you with the care you need, when you need it.  Your next appointment:   6 month(s)  Provider:   Alean Kobus, MD    We recommend signing up for the patient portal called MyChart.  Sign up information is provided on this After Visit Summary.  MyChart is used to connect with patients for Virtual Visits (Telemedicine).  Patients are able to view lab/test results, encounter notes, upcoming appointments, etc.  Non-urgent messages can be sent to your provider as well.   To learn more about what you can do with MyChart, go to [email or web address].   Other Instructions None

## 2024-06-22 ENCOUNTER — Other Ambulatory Visit (HOSPITAL_BASED_OUTPATIENT_CLINIC_OR_DEPARTMENT_OTHER): Payer: Self-pay

## 2024-06-22 ENCOUNTER — Ambulatory Visit (INDEPENDENT_AMBULATORY_CARE_PROVIDER_SITE_OTHER): Admit: 2024-06-22 | Discharge: 2024-06-22 | Disposition: A | Discharge status: Home / Self Care | Admitting: Radiology

## 2024-06-22 ENCOUNTER — Ambulatory Visit (HOSPITAL_BASED_OUTPATIENT_CLINIC_OR_DEPARTMENT_OTHER)
Admission: EM | Admit: 2024-06-22 | Discharge: 2024-06-22 | Disposition: A | Discharge status: Home / Self Care | Attending: Family Medicine | Admitting: Family Medicine

## 2024-06-22 ENCOUNTER — Encounter (HOSPITAL_BASED_OUTPATIENT_CLINIC_OR_DEPARTMENT_OTHER): Payer: Self-pay

## 2024-06-22 DIAGNOSIS — J4520 Mild intermittent asthma, uncomplicated: Secondary | ICD-10-CM

## 2024-06-22 DIAGNOSIS — R051 Acute cough: Secondary | ICD-10-CM

## 2024-06-22 DIAGNOSIS — J4541 Moderate persistent asthma with (acute) exacerbation: Secondary | ICD-10-CM | POA: Diagnosis not present

## 2024-06-22 LAB — POC COVID19/FLU A&B COMBO
Covid Antigen, POC: NEGATIVE
Influenza A Antigen, POC: NEGATIVE
Influenza B Antigen, POC: NEGATIVE

## 2024-06-22 MED ORDER — IPRATROPIUM-ALBUTEROL 0.5-2.5 (3) MG/3ML IN SOLN
3.0000 mL | Freq: Once | RESPIRATORY_TRACT | Status: AC
  Administered 2024-06-22: 3 mL via RESPIRATORY_TRACT

## 2024-06-22 MED ORDER — IPRATROPIUM-ALBUTEROL 0.5-2.5 (3) MG/3ML IN SOLN
3.0000 mL | Freq: Four times a day (QID) | RESPIRATORY_TRACT | 0 refills | Status: AC | PRN
  Filled 2024-06-22: qty 360, 30d supply, fill #0

## 2024-06-22 MED ORDER — METHYLPREDNISOLONE ACETATE 80 MG/ML IJ SUSP
80.0000 mg | Freq: Once | INTRAMUSCULAR | Status: AC
  Administered 2024-06-22: 80 mg via INTRAMUSCULAR

## 2024-06-22 MED ORDER — ALBUTEROL SULFATE HFA 108 (90 BASE) MCG/ACT IN AERS
2.0000 | INHALATION_SPRAY | Freq: Two times a day (BID) | RESPIRATORY_TRACT | 0 refills | Status: AC
  Filled 2024-06-22: qty 6.7, 25d supply, fill #0

## 2024-06-22 NOTE — Discharge Instructions (Addendum)
 Acute asthma exacerbation with cough: Rapid flu and rapid COVID are negative.  Chest x-ray is negative for pneumonia.  DuoNeb treatment resulted in improvement in breath sounds but he continues with wheezing.  Provided DuoNeb solution and home nebulizer machine for use at home.  Depo-Medrol  80 mg injection now.  Get plenty of fluids and rest.  Follow-up if symptoms do not improve, if symptoms worsen or if new symptoms occur.

## 2024-06-22 NOTE — ED Triage Notes (Addendum)
 Pt c/o chest congestion, sore throat, swelling on both side of neck, cough, started Saturday. Denies fever. Pt reports taking tylenol .

## 2024-06-22 NOTE — ED Provider Notes (Addendum)
 [facility]    CSN: [id number] Arrival date & time: 06/22/24  0908      History   Chief Complaint Chief Complaint  Patient presents with   Sore Throat   Cough   Nasal Congestion    HPI Ricky [name] is a 63 y.o. male.   63 year old male who reports acute onset of symptoms on Saturday, 06/19/2024.  He has cough, sore throat, swollen glands in his neck and wheezing.  He really does not have a lot of nasal congestion it seems to be in his neck throat and chest.  He is having some shortness of breath and coughing quite a bit sometimes at night.  He denies fever, body aches, nausea, vomiting, constipation, diarrhea.   Sore Throat Associated symptoms include shortness of breath. Pertinent negatives include no chest pain and no abdominal pain.  Cough Associated symptoms: shortness of breath, sore throat and wheezing   Associated symptoms: no chest pain, no chills, no ear pain, no fever and no rash     Past Medical History:  Diagnosis Date   A-fib (HCC)    Acquired hypothyroidism 08/11/2017   Acute on chronic diastolic heart failure (HCC) 10/23/2023   Adjustment disorder with depressed mood 03/22/2018   Allergic rhinitis 08/11/2017   Allergy to antithrombotic medication 08/11/2017   Anxiety    Ascending aorta dilatation 09/29/2023   Benign prostatic hyperplasia with lower urinary tract symptoms 08/16/2019   Bilateral shoulder pain 07/12/2014   Bipolar disorder, unspecified (HCC) 07/14/2019   BMI 50.0-59.9, adult (HCC) 08/11/2017   Chronic diastolic heart failure (HCC) 10/23/2023   Chronic fatigue 08/11/2017   Chronic pain of left knee 10/30/2017   Last Assessment & Plan:   Advised Aleve BID--2 at a time     Depression    Diverticulitis    Dysthymia 04/26/2019   Formatting of this note might be different from the original.  Trileptal  and Celexa  and doing OK overall     Last Assessment & Plan:   Formatting of this note might be different from the original.  Sees  [employer]     Elevated alkaline phosphatase level 12/18/2023   Essential (primary) hypertension 01/05/2021   Hyperlipidemia, unspecified 07/19/2019   Hypogonadism in male 08/11/2017   Last Assessment & Plan:   Cont testosterone  200 every 14d     Hypothyroidism    Impulse control disorder    Mass of right lobe of liver 05/27/2024   MDD (major depressive disorder), recurrent episode, severe (HCC) 04/14/2018   Mild intermittent asthma without complication 02/25/2024   Morbid (severe) obesity due to excess calories (HCC) 08/11/2017   Formatting of this note might be different from the original.  Last Assessment & Plan:   Formatting of this note might be different from the original.  Needs to work on this--never been small     Morbid obesity with BMI of 45.0-49.9, adult (HCC) 08/11/2017   Need for hepatitis C screening test 08/11/2017   Persistent atrial fibrillation (HCC) 08/21/2023   Posttraumatic stress disorder    Preoperative cardiovascular examination 09/29/2023   Primary osteoarthritis of right knee 07/12/2014   Pruritus of groin in male 12/18/2023   Seasonal allergies 12/18/2023   Severe recurrent major depression without psychotic features (HCC) 03/23/2018   Shoulder impingement syndrome, unspecified laterality 07/12/2014   TBI (traumatic brain injury) [facility])    Testicular lump 12/18/2023   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   Last Assessment & Plan:   Trial Sildenatil 20 2 to 3 1  hr before  $80 for 50     Vasomotor rhinitis 09/02/2017    Patient Active Problem List   Diagnosis Date Noted   A-fib [facility])    Mass of right lobe of liver 05/27/2024   Mild intermittent asthma without complication 02/25/2024   Testicular lump 12/18/2023   Seasonal allergies 12/18/2023   Pruritus of groin in male 12/18/2023   Elevated alkaline phosphatase level 12/18/2023   Diverticulitis    Chronic diastolic heart failure (HCC) 10/23/2023   Acute on chronic diastolic heart failure (HCC)  96/86/7974   Preoperative cardiovascular examination 09/29/2023   Ascending aorta dilatation 09/29/2023   Hypothyroidism    Depression    Persistent atrial fibrillation (HCC) 08/21/2023   Anxiety    TBI (traumatic brain injury) (HCC)    Essential (primary) hypertension 01/05/2021   Benign prostatic hyperplasia with lower urinary tract symptoms 08/16/2019   Hyperlipidemia, unspecified 07/19/2019   Bipolar disorder, unspecified (HCC) 07/14/2019   Dysthymia 04/26/2019   MDD (major depressive disorder), recurrent episode, severe (HCC) 04/14/2018   Posttraumatic stress disorder    Impulse control disorder    Severe recurrent major depression without psychotic features (HCC) 03/23/2018   Adjustment disorder with depressed mood 03/22/2018   Chronic pain of left knee 10/30/2017   Vasomotor rhinitis 09/02/2017   Acquired hypothyroidism 08/11/2017   Allergic rhinitis 08/11/2017   Allergy to antithrombotic medication 08/11/2017   Chronic fatigue 08/11/2017   Hypogonadism in male 08/11/2017   BMI 50.0-59.9, adult (HCC) 08/11/2017   Need for hepatitis C screening test 08/11/2017   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 Authorizing Provider  ipratropium-albuterol  (DUONEB) 0.5-2.5 (3) MG/3ML SOLN Take 3 mLs by nebulization every 6 (six) hours as needed. 06/22/24  Yes Ival Domino, FNP  albuterol  (VENTOLIN  HFA) 108 (90 Base) MCG/ACT inhaler Inhale 2 puffs into the lungs every 12 (twelve) hours. 06/22/24    Ival Domino, FNP  citalopram  (CELEXA ) 20 MG tablet Take 1 tablet (20 mg total) by mouth daily. For mood control 04/18/18   Money, Caron NOVAK, FNP  clobetasol cream (TEMOVATE) 0.05 % Apply 1 Application topically as needed. 04/08/24   [provider]  ELIQUIS  5 MG TABS tablet Take 1 tablet (5 mg total) by mouth 2 (two) times daily. 05/04/24   Madireddy, Alean SAUNDERS, MD  fluticasone  (FLONASE ) 50 MCG/ACT nasal spray Place 2 sprays into both nostrils 2 (two) times daily. 05/05/24   [name], [name], [name]  furosemide  (LASIX ) 40 MG tablet Take 1 tablet (40 mg total) by mouth daily. Take an additional 40 mg today and  tomorrow 06/14/24   Madireddy, Alean SAUNDERS, MD  JARDIANCE  10 MG TABS tablet TAKE ONE TABLET (10MG ) BY MOUTH EVERY DAY BEFORE BREAKFAST. 05/14/24   Madireddy, Alean SAUNDERS, MD  levocetirizine (XYZAL ) 5 MG tablet Take 1 tablet (5 mg total) by mouth every evening. 12/18/23   [name], [name], [name]  levothyroxine  (SYNTHROID ) 50 MCG tablet Take 1 tablet (50 mcg total) by mouth daily. 11/24/23  [name], [name], [name]  metoprolol  tartrate (LOPRESSOR ) 25 MG tablet Take 1 tablet (25 mg total) by mouth 2 (two) times daily. 08/21/23 06/14/24  Madireddy, Alean SAUNDERS, MD  mometasone-formoterol (DULERA) 200-5 MCG/ACT AERO Inhale 2 puffs into the lungs 2 (two) times daily. 05/27/24   [name], [name], [name]  OXcarbazepine  (TRILEPTAL ) 150 MG tablet Take 1 tablet (150 mg total) by mouth 2 (two) times daily. For anxiety and mood control 04/18/18   Money, Caron NOVAK, FNP    Family History Family History  Problem Relation Age of Onset   Diabetes Mother    Cancer Father    Hypertension Neg Hx    Heart disease Neg Hx     Social History Social History   Tobacco Use   Smoking status: Never    Passive exposure: Never   Smokeless tobacco: Never  Vaping Use   Vaping status: Never Used  Substance Use Topics   Alcohol use: Not Currently   Drug use: Not Currently    Types: Marijuana     Allergies   Patient  has no known allergies.   Review of Systems Review of Systems  Constitutional:  Negative for chills and fever.  HENT:  Positive for sore throat. Negative for ear pain.   Eyes:  Negative for pain and visual disturbance.  Respiratory:  Positive for cough, shortness of breath and wheezing.   Cardiovascular:  Negative for chest pain and palpitations.  Gastrointestinal:  Negative for abdominal pain, constipation, diarrhea, nausea and vomiting.  Genitourinary:  Negative for dysuria and hematuria.  Musculoskeletal:  Negative for arthralgias and back pain.  Skin:  Negative for color change and rash.  Neurological:  Negative for seizures and syncope.  All other systems reviewed and are negative.    Physical Exam Triage Vital Signs ED Triage Vitals  Encounter Vitals Group     BP 06/22/24 1001 108/73     Girls Systolic BP Percentile --      Girls Diastolic BP Percentile --      Boys Systolic BP Percentile --      Boys Diastolic BP Percentile --      Pulse Rate 06/22/24 1001 87     Resp 06/22/24 1001 18     Temp 06/22/24 1001 98.5 F (36.9 C)     Temp Source 06/22/24 1001 Oral     SpO2 06/22/24 1001 95 %     Weight --      Height --      Head Circumference --      Peak Flow --      Pain Score 06/22/24 0959 6     Pain Loc --      Pain Education --      Exclude from Growth Chart --    No data found.  Updated Vital Signs BP 108/73 (BP Location: Left Arm)   Pulse 87   Temp 98.5 F (36.9 C) (Oral)   Resp 18   SpO2 95%   Visual Acuity Right Eye Distance:   Left Eye Distance:   Bilateral Distance:    Right Eye Near:   Left Eye Near:    Bilateral Near:     Physical Exam Vitals and nursing note reviewed.  Constitutional:      General: He is not in acute distress.    Appearance: He is well-developed. He is not ill-appearing, toxic-appearing or diaphoretic.  HENT:     Head: Normocephalic and atraumatic.     Right Ear: Hearing, tympanic membrane,  ear canal and external  ear normal.     Left Ear: Hearing, tympanic membrane, ear canal and external ear normal.     Nose: No congestion or rhinorrhea.     Right Sinus: No maxillary sinus tenderness or frontal sinus tenderness.     Left Sinus: No maxillary sinus tenderness or frontal sinus tenderness.     Mouth/Throat:     Lips: Pink.     Mouth: Mucous membranes are moist.     Pharynx: Uvula midline. Posterior oropharyngeal erythema present. No oropharyngeal exudate.     Tonsils: No tonsillar exudate.  Eyes:     Conjunctiva/sclera: Conjunctivae normal.     Pupils: Pupils are equal, round, and reactive to light.  Cardiovascular:     Rate and Rhythm: Normal rate and regular rhythm.     Heart sounds: S1 normal and S2 normal. No murmur heard. Pulmonary:     Effort: Pulmonary effort is normal. No respiratory distress.     Breath sounds: Examination of the right-upper field reveals wheezing. Examination of the left-upper field reveals wheezing. Examination of the right-middle field reveals wheezing. Examination of the left-middle field reveals wheezing. Examination of the right-lower field reveals wheezing. Examination of the left-lower field reveals wheezing. Wheezing (Diffuse inspiratory and expiratory wheezing) present. No decreased breath sounds, rhonchi or rales.  Abdominal:     General: Bowel sounds are normal.     Palpations: Abdomen is soft.     Tenderness: There is no abdominal tenderness.  Musculoskeletal:        General: No swelling.     Cervical back: Neck supple.  Lymphadenopathy:     Head:     Right side of head: Tonsillar adenopathy present. No submental, submandibular, preauricular or posterior auricular adenopathy.     Left side of head: Tonsillar adenopathy present. No submental, submandibular, preauricular or posterior auricular adenopathy.     Cervical: Cervical adenopathy present.     Right cervical: Superficial cervical adenopathy present.     Left cervical: Superficial cervical adenopathy  present.  Skin:    General: Skin is warm and dry.     Capillary Refill: Capillary refill takes less than 2 seconds.     Findings: No rash.  Neurological:     Mental Status: He is alert and oriented to person, place, and time.  Psychiatric:        Mood and Affect: Mood normal.      UC Treatments / Results  Labs (all labs ordered are listed, but only abnormal results are displayed) Labs Reviewed  POC COVID19/FLU A&B COMBO - Normal    EKG   Radiology DG Chest 2 View Result Date: 06/22/2024 CLINICAL DATA:  Cough. Chest congestion. Sore throat. BILATERAL neck swelling. EXAM: CHEST - 2 VIEW COMPARISON:  12/20/2023 FINDINGS: Cardiomediastinal silhouette and pulmonary vasculature are within normal limits. Lungs are clear. IMPRESSION: No acute cardiopulmonary process. Electronically Signed   By: Aliene Lloyd M.D.   On: 06/22/2024 11:07    Procedures Procedures (including critical care time)  Medications Ordered in UC Medications  ipratropium-albuterol  (DUONEB) 0.5-2.5 (3) MG/3ML nebulizer solution 3 mL (3 mLs Nebulization Given 06/22/24 1039)  methylPREDNISolone  acetate (DEPO-MEDROL ) injection 80 mg (80 mg Intramuscular Given 06/22/24 1147)    Initial Impression / Assessment and Plan / UC Course  I have reviewed the triage vital signs and the nursing notes.  Pertinent labs & imaging results that were available during my care of the patient were reviewed by me and considered in my medical  decision making (see chart for details).  Plan of Care: Acute asthma exacerbation with cough: Rapid flu and rapid COVID are negative.  Chest x-ray is negative for pneumonia.  DuoNeb treatment resulted in improvement in breath sounds but he continues with wheezing.  Provided DuoNeb solution and home nebulizer machine for use at home.  Depo-Medrol  80 mg injection now.  Get plenty of fluids and rest.  Follow-up if symptoms do not improve, if symptoms worsen or if new symptoms occur.  I reviewed  the plan of care with the patient and/or the patient's guardian.  The patient and/or guardian had time to ask questions and acknowledged that the questions were answered.  I provided instruction on symptoms or reasons to return here or to go to an ER, if symptoms/condition did not improve, worsened or if new symptoms occurred.  Final Clinical Impressions(s) / UC Diagnoses   Final diagnoses:  Acute cough  Moderate persistent asthma with acute exacerbation     Discharge Instructions      Acute asthma exacerbation with cough: Rapid flu and rapid COVID are negative.  Chest x-ray is negative for pneumonia.  DuoNeb treatment resulted in improvement in breath sounds but he continues with wheezing.  Provided DuoNeb solution and home nebulizer machine for use at home.  Depo-Medrol  80 mg injection now.  Get plenty of fluids and rest.  Follow-up if symptoms do not improve, if symptoms worsen or if new symptoms occur.     ED Prescriptions     Medication Sig Dispense Auth. Provider   ipratropium-albuterol  (DUONEB) 0.5-2.5 (3) MG/3ML SOLN Take 3 mLs by nebulization every 6 (six) hours as needed. 360 mL Ival Domino, FNP   albuterol  (VENTOLIN  HFA) 108 (90 Base) MCG/ACT inhaler Inhale 2 puffs into the lungs every 12 (twelve) hours. 6.7 g Ival Domino, FNP      PDMP not reviewed this encounter.   Ival Domino, FNP 06/22/24 1218    Ival Domino, FNP 06/22/24 [id number]

## 2024-06-23 ENCOUNTER — Ambulatory Visit (HOSPITAL_BASED_OUTPATIENT_CLINIC_OR_DEPARTMENT_OTHER): Admitting: Student

## 2024-06-28 ENCOUNTER — Other Ambulatory Visit (HOSPITAL_BASED_OUTPATIENT_CLINIC_OR_DEPARTMENT_OTHER): Payer: Self-pay

## 2024-06-28 ENCOUNTER — Ambulatory Visit (HOSPITAL_BASED_OUTPATIENT_CLINIC_OR_DEPARTMENT_OTHER): Admitting: Student

## 2024-06-28 ENCOUNTER — Encounter (HOSPITAL_BASED_OUTPATIENT_CLINIC_OR_DEPARTMENT_OTHER): Payer: Self-pay | Admitting: Student

## 2024-06-28 VITALS — BP 108/71 | HR 84 | Temp 98.3°F | Resp 16 | Ht 72.0 in | Wt 339.3 lb

## 2024-06-28 DIAGNOSIS — R748 Abnormal levels of other serum enzymes: Secondary | ICD-10-CM

## 2024-06-28 DIAGNOSIS — J4541 Moderate persistent asthma with (acute) exacerbation: Secondary | ICD-10-CM

## 2024-06-28 DIAGNOSIS — Z6841 Body Mass Index (BMI) 40.0 and over, adult: Secondary | ICD-10-CM

## 2024-06-28 DIAGNOSIS — J329 Chronic sinusitis, unspecified: Secondary | ICD-10-CM | POA: Diagnosis not present

## 2024-06-28 DIAGNOSIS — J40 Bronchitis, not specified as acute or chronic: Secondary | ICD-10-CM

## 2024-06-28 MED ORDER — PREDNISONE 10 MG (21) PO TBPK
ORAL_TABLET | ORAL | 0 refills | Status: AC
  Filled 2024-06-28: qty 21, 6d supply, fill #0

## 2024-06-28 MED ORDER — AMOXICILLIN-POT CLAVULANATE 875-125 MG PO TABS
1.0000 | ORAL_TABLET | Freq: Two times a day (BID) | ORAL | 0 refills | Status: AC
  Filled 2024-06-28: qty 14, 7d supply, fill #0

## 2024-06-28 NOTE — Patient Instructions (Signed)
 It was nice to see you today!  As we discussed in clinic:  - I will let you know how the electrography comes back  If you have any problems before your next visit feel free to message me via MyChart (minor issues or questions) or call the office, otherwise you may reach out to schedule an office visit.  Thank you! Sandhya Denherder, [name]

## 2024-06-28 NOTE — Progress Notes (Signed)
 Established Patient Office Visit  Subjective   Patient ID: Ricky [name], male    DOB: 1961/03/16  Age: 63 y.o. MRN: [id number]  Chief Complaint  Patient presents with   Weight Loss    Would like to discuss weight loss. Wanted to start wegovy. Dr. Liborio said it was ok. Feels he is eating pretty good.    Cough    Pt was at [facility] on 06/22/2024 and is still having a cough. Taking albuterol  inhaler, duoneb.     HPI  Discussed the use of AI scribe software for clinical note transcription with the patient, who gave verbal consent to proceed.  History of Present Illness   Ricky [name] is a 63 year old male with asthma who presents with persistent wheezing and congestion.  He has been experiencing persistent wheezing and congestion for more than seven days, which have not improved despite receiving a steroid shot last week. The steroid shot helped reduce throat swelling but did not alleviate wheezing or congestion. He continues to experience a tickle in his throat and has difficulty sleeping due to these symptoms. He uses albuterol  and a nebulizer but still experiences significant congestion and an itchy throat.  He denies facial pain or pressure and reports blowing out 'old green stuff' without feeling any pressure. He had difficulty swallowing last week. Cough and congestion are the main issues rather than a runny nose. He describes symptoms as possibly related to allergies, with increased congestion and an itchy throat when outside.  He is concerned about weight gain despite eating well and being active. He has not yet spoken to a dietitian but has previously discussed his weight with a cardiologist. He is interested in losing weight to prepare for knee surgery.      Patient Active Problem List   Diagnosis Date Noted   A-fib [facility])    Mass of right lobe of liver 05/27/2024   Mild intermittent asthma without complication 02/25/2024   Testicular lump 12/18/2023   Seasonal allergies 12/18/2023    Pruritus of groin in male 12/18/2023   Elevated alkaline phosphatase level 12/18/2023   Diverticulitis    Chronic diastolic heart failure (HCC) 10/23/2023   Acute on chronic diastolic heart failure (HCC) 10/23/2023   Preoperative cardiovascular examination 09/29/2023   Ascending aorta dilatation 09/29/2023   Hypothyroidism    Depression    Persistent atrial fibrillation (HCC) 08/21/2023   Anxiety    TBI (traumatic brain injury) (HCC)    Essential (primary) hypertension 01/05/2021   Benign prostatic hyperplasia with lower urinary tract symptoms 08/16/2019   Hyperlipidemia, unspecified 07/19/2019   Bipolar disorder, unspecified (HCC) 07/14/2019   Dysthymia 04/26/2019   MDD (major depressive disorder), recurrent episode, severe (HCC) 04/14/2018   Posttraumatic stress disorder    Impulse control disorder    Severe recurrent major depression without psychotic features (HCC) 03/23/2018   Adjustment disorder with depressed mood 03/22/2018   Chronic pain of left knee 10/30/2017   Vasomotor rhinitis 09/02/2017   Acquired hypothyroidism 08/11/2017   Allergic rhinitis 08/11/2017   Allergy to antithrombotic medication 08/11/2017   Chronic fatigue 08/11/2017   Hypogonadism in male 08/11/2017   BMI 50.0-59.9, adult (HCC) 08/11/2017   Need for hepatitis C screening test 08/11/2017   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   Morbid (severe) obesity due to excess calories (HCC) 08/11/2017   Morbid obesity with BMI of 45.0-49.9, adult (HCC) 08/11/2017   Bilateral shoulder pain 07/12/2014   Primary osteoarthritis of right knee 07/12/2014   Shoulder impingement  syndrome, unspecified laterality 07/12/2014   Past Medical History:  Diagnosis Date   A-fib (HCC)    Acquired hypothyroidism 08/11/2017   Acute on chronic diastolic heart failure (HCC) 10/23/2023   Adjustment disorder with depressed mood 03/22/2018   Allergic rhinitis 08/11/2017   Allergy to antithrombotic medication  08/11/2017   Anxiety    Ascending aorta dilatation 09/29/2023   Benign prostatic hyperplasia with lower urinary tract symptoms 08/16/2019   Bilateral shoulder pain 07/12/2014   Bipolar disorder, unspecified (HCC) 07/14/2019   BMI 50.0-59.9, adult (HCC) 08/11/2017   Chronic diastolic heart failure (HCC) 10/23/2023   Chronic fatigue 08/11/2017   Chronic pain of left knee 10/30/2017   Last Assessment & Plan:   Advised Aleve BID--2 at a time     Depression    Diverticulitis    Dysthymia 04/26/2019   Formatting of this note might be different from the original.  Trileptal  and Celexa  and doing OK overall     Last Assessment & Plan:   Formatting of this note might be different from the original.  Sees [employer]     Elevated alkaline phosphatase level 12/18/2023   Essential (primary) hypertension 01/05/2021   Hyperlipidemia, unspecified 07/19/2019   Hypogonadism in male 08/11/2017   Last Assessment & Plan:   Cont testosterone  200 every 14d     Hypothyroidism    Impulse control disorder    Mass of right lobe of liver 05/27/2024   MDD (major depressive disorder), recurrent episode, severe (HCC) 04/14/2018   Mild intermittent asthma without complication 02/25/2024   Morbid (severe) obesity due to excess calories (HCC) 08/11/2017   Formatting of this note might be different from the original.  Last Assessment & Plan:   Formatting of this note might be different from the original.  Needs to work on this--never been small     Morbid obesity with BMI of 45.0-49.9, adult (HCC) 08/11/2017   Need for hepatitis C screening test 08/11/2017   Persistent atrial fibrillation (HCC) 08/21/2023   Posttraumatic stress disorder    Preoperative cardiovascular examination 09/29/2023   Primary osteoarthritis of right knee 07/12/2014   Pruritus of groin in male 12/18/2023   Seasonal allergies 12/18/2023   Severe recurrent major depression without psychotic features (HCC) 03/23/2018   Shoulder impingement syndrome,  unspecified laterality 07/12/2014   TBI (traumatic brain injury) [facility])    Testicular lump 12/18/2023   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    Objective:     BP 108/71   Pulse 84   Temp 98.3 F (36.8 C) (Oral)   Resp 16   Ht 6' (1.829 m)   Wt (!) 339 lb 4.8 oz (153.9 kg)   SpO2 96%   BMI 46.02 kg/m  BP Readings from Last 3 Encounters:  06/28/24 108/71  06/22/24 108/73  06/14/24 114/72   Wt Readings from Last 3 Encounters:  06/28/24 (!) 339 lb 4.8 oz (153.9 kg)  06/14/24 (!) 337 lb (152.9 kg)  05/27/24 ROLLEN)  338 lb 6.4 oz (153.5 kg)   SpO2 Readings from Last 3 Encounters:  06/28/24 96%  06/22/24 95%  06/14/24 99%      Physical Exam Constitutional:      General: He is not in acute distress.    Appearance: Normal appearance. He is not ill-appearing.  HENT:     Head: Normocephalic and atraumatic.     Right Ear: External ear normal.     Left Ear: External ear normal.     Nose: Nose normal.  Eyes:     Conjunctiva/sclera: Conjunctivae normal.  Cardiovascular:     Rate and Rhythm: Normal rate and regular rhythm.     Pulses: Normal pulses.     Heart sounds: Normal heart sounds. No murmur heard.    No friction rub.  Pulmonary:     Effort: Pulmonary effort is normal. No respiratory distress.     Breath sounds: Wheezing present. No rhonchi or rales.  Skin:    General: Skin is warm and dry.     Coloration: Skin is not jaundiced or pale.  Neurological:     Mental Status: He is alert.  Psychiatric:        Mood and Affect: Mood normal.        Behavior:  Behavior normal.      No results found for any visits on 06/28/24.  Last CBC Lab Results  Component Value Date   WBC 10.1 05/27/2024   HGB 14.0 05/27/2024   HCT 43.5 05/27/2024   MCV 94 05/27/2024   MCH 30.2 05/27/2024   RDW 12.9 05/27/2024   PLT 266 05/27/2024   Last metabolic panel Lab Results  Component Value Date   GLUCOSE 71 05/27/2024   NA 140 05/27/2024   K 4.4 05/27/2024   CL 101 05/27/2024   CO2 24 05/27/2024   BUN 24 05/27/2024   CREATININE 0.91 05/27/2024   EGFR 96 05/27/2024   CALCIUM 8.9 05/27/2024   PROT 6.1 05/27/2024   ALBUMIN 4.1 05/27/2024   LABGLOB 2.0 05/27/2024   BILITOT 0.4 05/27/2024   ALKPHOS 120 05/27/2024   AST 19 05/27/2024   ALT 20 05/27/2024   ANIONGAP 6 03/21/2018   Last lipids Lab Results  Component Value Date   CHOL 159 05/27/2024   HDL 46 05/27/2024   LDLCALC 99 05/27/2024   TRIG 73 05/27/2024   CHOLHDL 3.5 05/27/2024   Last hemoglobin A1c Lab Results  Component Value Date   HGBA1C 5.5 11/27/2023      The 10-year ASCVD risk score (Arnett DK, et al., 2019) is: 7.1%    Assessment & Plan:   Assessment and Plan    Morbid obesity with suspected fatty liver disease Morbid obesity with a desire to lose weight, particularly to reach 300 pounds for upcoming knee surgery. Suspected fatty liver disease, which may qualify him for [facility] under Medicaid coverage criteria. Discussed the need for elastography to confirm fatty liver disease diagnosis. He is motivated to lose weight and is aware of the challenges in obtaining Wegovy due to Medicaid restrictions. - Ordered elastography to assess for fatty liver disease - If elastography confirms fatty liver disease, will initiate Wegovy with monthly dose escalation  Moderate persistent asthma with acute exacerbation Acute exacerbation of moderate persistent asthma with wheezing, congestion, and itchy throat. Previous steroid shot provided partial relief. Current treatment includes  Dulera and albuterol  as needed. Discussed the use of oral steroids for longer duration effect. He prefers oral steroids over Kenalog  shot for extended relief. -  Prescribed oral steroid pack for 6 days - Continue Dulera daily - Use albuterol  as needed  Sinobronchitis Persistent congestion and itchy throat. No facial pain or pressure. Decision to treat with antibiotics and steroids due to prolonged symptoms. He reports mainly cough and congestion, with no significant rhinorrhea. - Prescribed Augmentin  twice daily for 7 days - Advised to take Augmentin  with food      Return if symptoms worsen or fail to improve.    Lamoine Magallon T Gamaliel Charney, [name]

## 2024-07-05 ENCOUNTER — Ambulatory Visit (INDEPENDENT_AMBULATORY_CARE_PROVIDER_SITE_OTHER)
Admission: RE | Admit: 2024-07-05 | Discharge: 2024-07-05 | Disposition: A | Discharge status: Home / Self Care | Source: Ambulatory Visit | Attending: Student | Admitting: Student

## 2024-07-05 DIAGNOSIS — K760 Fatty (change of) liver, not elsewhere classified: Secondary | ICD-10-CM

## 2024-07-05 DIAGNOSIS — R748 Abnormal levels of other serum enzymes: Secondary | ICD-10-CM

## 2024-07-07 ENCOUNTER — Ambulatory Visit (HOSPITAL_BASED_OUTPATIENT_CLINIC_OR_DEPARTMENT_OTHER): Payer: Self-pay | Admitting: Student

## 2024-08-11 ENCOUNTER — Other Ambulatory Visit: Payer: Self-pay

## 2024-08-11 ENCOUNTER — Other Ambulatory Visit (HOSPITAL_BASED_OUTPATIENT_CLINIC_OR_DEPARTMENT_OTHER): Payer: Self-pay | Admitting: Student

## 2024-08-11 DIAGNOSIS — E039 Hypothyroidism, unspecified: Secondary | ICD-10-CM

## 2024-08-30 ENCOUNTER — Other Ambulatory Visit (HOSPITAL_BASED_OUTPATIENT_CLINIC_OR_DEPARTMENT_OTHER): Payer: Self-pay

## 2024-08-30 ENCOUNTER — Ambulatory Visit (HOSPITAL_BASED_OUTPATIENT_CLINIC_OR_DEPARTMENT_OTHER)
Admission: EM | Admit: 2024-08-30 | Discharge: 2024-08-30 | Disposition: A | Discharge status: Home / Self Care | Source: Home / Self Care

## 2024-08-30 ENCOUNTER — Encounter (HOSPITAL_BASED_OUTPATIENT_CLINIC_OR_DEPARTMENT_OTHER): Payer: Self-pay | Admitting: Emergency Medicine

## 2024-08-30 DIAGNOSIS — R509 Fever, unspecified: Secondary | ICD-10-CM

## 2024-08-30 DIAGNOSIS — R52 Pain, unspecified: Secondary | ICD-10-CM

## 2024-08-30 DIAGNOSIS — J111 Influenza due to unidentified influenza virus with other respiratory manifestations: Secondary | ICD-10-CM

## 2024-08-30 DIAGNOSIS — R051 Acute cough: Secondary | ICD-10-CM

## 2024-08-30 DIAGNOSIS — J208 Acute bronchitis due to other specified organisms: Secondary | ICD-10-CM | POA: Diagnosis not present

## 2024-08-30 MED ORDER — TRIAMCINOLONE ACETONIDE 40 MG/ML IJ SUSP
40.0000 mg | Freq: Once | INTRAMUSCULAR | Status: AC
  Administered 2024-08-30: 40 mg via INTRAMUSCULAR

## 2024-08-30 MED ORDER — PROMETHAZINE-DM 6.25-15 MG/5ML PO SYRP
5.0000 mL | ORAL_SOLUTION | Freq: Four times a day (QID) | ORAL | 0 refills | Status: AC | PRN
  Filled 2024-08-30: qty 118, 6d supply, fill #0

## 2024-08-30 MED ORDER — OSELTAMIVIR PHOSPHATE 75 MG PO CAPS
75.0000 mg | ORAL_CAPSULE | Freq: Two times a day (BID) | ORAL | 0 refills | Status: AC
  Filled 2024-08-30: qty 10, 5d supply, fill #0

## 2024-08-30 NOTE — Discharge Instructions (Addendum)
 Viral colitis with influenza, fever, cough, body aches: Tamiflu  75 mg twice daily for 5 days.  Get plenty of fluids and rest.  Promethazine  DM, 5 mL, every 6 hours if needed for cough.  Continue Ventolin /albuterol  inhaler, 2 puffs, every 4 hours if needed for wheezing.  Patient has an inhaler at home and does not need a refill right now.  Kenalog  40 mg injection now.  Patient is retired and does not need a work excuse.  Follow-up here or with primary care if symptoms do not improve, worsen or new symptoms occur.

## 2024-08-30 NOTE — ED Triage Notes (Signed)
 Pt reports he thinks his brother had the flu and now pt has nasal congestion, chest and sinus congestion and he has body aches. Pt reports fever on and off for a couple days.

## 2024-08-30 NOTE — ED Provider Notes (Signed)
 " [name]    CSN: [id number] Arrival date & time: 08/30/24  1034      History   Chief Complaint Chief Complaint  Patient presents with   Nasal Congestion   Fever    HPI Ricky [name] is a 64 y.o. male.   64 year old male with reports that his brother has the flu.  The patient has nasal congestion, chest congestion, sinus pressure and bodyaches.  He also has cough and fever.  He has been using his inhaler for intermittent wheezing symptoms started on 08/28/2024.  He is worried he has the flu.   Fever Associated symptoms: congestion, cough and rhinorrhea   Associated symptoms: no chest pain, no chills, no diarrhea, no dysuria, no ear pain, no nausea, no rash, no sore throat and no vomiting     Past Medical History:  Diagnosis Date   A-fib (HCC)    Acquired hypothyroidism 08/11/2017   Acute on chronic diastolic heart failure (HCC) 10/23/2023   Adjustment disorder with depressed mood 03/22/2018   Allergic rhinitis 08/11/2017   Allergy to antithrombotic medication 08/11/2017   Anxiety    Ascending aorta dilatation 09/29/2023   Benign prostatic hyperplasia with lower urinary tract symptoms 08/16/2019   Bilateral shoulder pain 07/12/2014   Bipolar disorder, unspecified (HCC) 07/14/2019   BMI 50.0-59.9, adult (HCC) 08/11/2017   Chronic diastolic heart failure (HCC) 10/23/2023   Chronic fatigue 08/11/2017   Chronic pain of left knee 10/30/2017   Last Assessment & Plan:   Advised Aleve BID--2 at a time     Depression    Diverticulitis    Dysthymia 04/26/2019   Formatting of this note might be different from the original.  Trileptal  and Celexa  and doing OK overall     Last Assessment & Plan:   Formatting of this note might be different from the original.  Sees [employer]     Elevated alkaline phosphatase level 12/18/2023   Essential (primary) hypertension 01/05/2021   Hyperlipidemia, unspecified 07/19/2019   Hypogonadism in male 08/11/2017   Last Assessment & Plan:    Cont testosterone  200 every 14d     Hypothyroidism    Impulse control disorder    Mass of right lobe of liver 05/27/2024   MDD (major depressive disorder), recurrent episode, severe (HCC) 04/14/2018   Mild intermittent asthma without complication 02/25/2024   Morbid (severe) obesity due to excess calories (HCC) 08/11/2017   Formatting of this note might be different from the original.  Last Assessment & Plan:   Formatting of this note might be different from the original.  Needs to work on this--never been small     Morbid obesity with BMI of 45.0-49.9, adult (HCC) 08/11/2017   Need for hepatitis C screening test 08/11/2017   Persistent atrial fibrillation (HCC) 08/21/2023   Posttraumatic stress disorder    Preoperative cardiovascular examination 09/29/2023   Primary osteoarthritis of right knee 07/12/2014   Pruritus of groin in male 12/18/2023   Seasonal allergies 12/18/2023   Severe recurrent major depression without psychotic features (HCC) 03/23/2018   Shoulder impingement syndrome, unspecified laterality 07/12/2014   TBI (traumatic brain injury) [facility])    Testicular lump 12/18/2023   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   Last Assessment & Plan:   Trial Sildenatil 20 2 to 3 1 hr before  $80 for 50     Vasomotor rhinitis 09/02/2017    Patient Active Problem List   Diagnosis Date Noted   A-fib [facility])    Mass of right lobe  of liver 05/27/2024   Mild intermittent asthma without complication 02/25/2024   Testicular lump 12/18/2023   Seasonal allergies 12/18/2023   Pruritus of groin in male 12/18/2023   Elevated alkaline phosphatase level 12/18/2023   Diverticulitis    Chronic diastolic heart failure (HCC) 10/23/2023   Acute on chronic diastolic heart failure (HCC) 10/23/2023   Preoperative cardiovascular examination 09/29/2023   Ascending aorta dilatation 09/29/2023   Hypothyroidism    Depression    Persistent atrial fibrillation (HCC) 08/21/2023   Anxiety    TBI  (traumatic brain injury) (HCC)    Essential (primary) hypertension 01/05/2021   Benign prostatic hyperplasia with lower urinary tract symptoms 08/16/2019   Hyperlipidemia, unspecified 07/19/2019   Bipolar disorder, unspecified (HCC) 07/14/2019   Dysthymia 04/26/2019   MDD (major depressive disorder), recurrent episode, severe (HCC) 04/14/2018   Posttraumatic stress disorder    Impulse control disorder    Severe recurrent major depression without psychotic features (HCC) 03/23/2018   Adjustment disorder with depressed mood 03/22/2018   Chronic pain of left knee 10/30/2017   Vasomotor rhinitis 09/02/2017   Acquired hypothyroidism 08/11/2017   Allergic rhinitis 08/11/2017   Allergy to antithrombotic medication 08/11/2017   Chronic fatigue 08/11/2017   Hypogonadism in male 08/11/2017   BMI 50.0-59.9, adult (HCC) 08/11/2017   Need for hepatitis C screening test 08/11/2017   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 Authorizing Provider  citalopram  (CELEXA ) 20 MG tablet Take 1 tablet (20 mg total) by mouth daily. For mood control 04/18/18  Yes Money, Caron NOVAK, FNP  ELIQUIS  5 MG TABS tablet Take 1 tablet (5 mg total) by mouth 2 (two) times daily. 05/04/24  Yes Madireddy, Alean SAUNDERS, MD  furosemide  (LASIX ) 40 MG tablet Take 1 tablet (40 mg total) by mouth daily. Take an additional 40 mg today and  tomorrow 06/14/24  Yes Madireddy, Alean SAUNDERS, MD  JARDIANCE  10 MG TABS tablet TAKE ONE TABLET (10MG ) BY MOUTH EVERY DAY  BEFORE BREAKFAST. 05/14/24  Yes Madireddy, Alean SAUNDERS, MD  metoprolol  tartrate (LOPRESSOR ) 25 MG tablet Take 1 tablet (25 mg total) by mouth 2 (two) times daily. 08/11/24 11/09/24 Yes Madireddy, Alean SAUNDERS, MD  mometasone -formoterol  (DULERA) 200-5 MCG/ACT AERO Inhale 2 puffs into the lungs 2 (two) times daily. 05/27/24  Yes [name], [name], [name]  oseltamivir  (TAMIFLU ) 75 MG capsule Take 1 capsule (75 mg total) by mouth every 12 (twelve) hours. 08/30/24  Yes Ival Domino, FNP  OXcarbazepine  (TRILEPTAL ) 150 MG tablet Take 1 tablet (150 mg total) by mouth 2 (two) times daily. For anxiety and mood control 04/18/18  Yes Money, Caron NOVAK, FNP  predniSONE  (STERAPRED UNI-PAK 21 TAB) 10 MG (21) TBPK tablet Use as directed. 06/28/24  Yes [name], [name], [name]  promethazine -dextromethorphan (PROMETHAZINE -DM) 6.25-15 MG/5ML syrup Take 5 mLs by mouth 4 (four) times daily as needed for cough. Do not use and drive - May make drowsy. 08/30/24  Yes Ival Domino, FNP  albuterol  (VENTOLIN  HFA) 108 (90 Base) MCG/ACT inhaler Inhale  2 puffs into the lungs every 12 (twelve) hours. 06/22/24   Ival Domino, FNP  clobetasol cream (TEMOVATE) 0.05 % Apply 1 Application topically as needed. 04/08/24   [provider]  fluticasone  (FLONASE ) 50 MCG/ACT nasal spray Place 2 sprays into both nostrils 2 (two) times daily. 05/05/24   [name], [name], [name]  ipratropium-albuterol  (DUONEB) 0.5-2.5 (3) MG/3ML SOLN Take 3 mLs by nebulization every 6 (six) hours as needed. 06/22/24   Ival Domino, FNP  levocetirizine (XYZAL ) 5 MG tablet Take 1 tablet (5 mg total) by mouth every evening. 12/18/23   [name], [name], [name]  levothyroxine  (SYNTHROID ) 50 MCG tablet TAKE ONE TABLET BY MOUTH EVERY DAY. 08/11/24   [name], [name], [name]    Family History Family History  Problem Relation Age of Onset   Diabetes Mother    Cancer Father    Hypertension Neg Hx    Heart disease Neg Hx     Social History Social  History[1]   Allergies   Patient has no known allergies.   Review of Systems Review of Systems  Constitutional:  Positive for fever. Negative for chills.  HENT:  Positive for congestion, postnasal drip, rhinorrhea, sinus pressure and sinus pain. Negative for ear pain and sore throat.   Eyes:  Negative for pain and visual disturbance.  Respiratory:  Positive for cough and wheezing.   Cardiovascular:  Negative for chest pain and palpitations.  Gastrointestinal:  Negative for abdominal pain, constipation, diarrhea, nausea and vomiting.  Genitourinary:  Negative for dysuria and hematuria.  Musculoskeletal:  Positive for arthralgias. Negative for back pain.  Skin:  Negative for color change and rash.  Neurological:  Negative for seizures and syncope.  All other systems reviewed and are negative.    Physical Exam Triage Vital Signs ED Triage Vitals  Encounter Vitals Group     BP 08/30/24 1119 114/70     Girls Systolic BP Percentile --      Girls Diastolic BP Percentile --      Boys Systolic BP Percentile --      Boys Diastolic BP Percentile --      Pulse Rate 08/30/24 1119 81     Resp 08/30/24 1119 18     Temp 08/30/24 1119 98 F (36.7 C)     Temp Source 08/30/24 1119 Oral     SpO2 08/30/24 1119 96 %     Weight --      Height --      Head Circumference --      Peak Flow --      Pain Score 08/30/24 1118 0     Pain Loc --      Pain Education --      Exclude from Growth Chart --    No data found.  Updated Vital Signs BP 114/70 (BP Location: Right Arm)   Pulse 81   Temp 98 F (36.7 C) (Oral)   Resp 18   SpO2 96%   Visual Acuity Right Eye Distance:   Left Eye Distance:   Bilateral Distance:    Right Eye Near:   Left Eye Near:    Bilateral Near:     Physical Exam Vitals and nursing note reviewed.  Constitutional:      General: He is not in acute distress.    Appearance: He is well-developed. He is morbidly obese. He is not ill-appearing, toxic-appearing or  diaphoretic.  HENT:     Head: Normocephalic and atraumatic.     Right Ear:  Hearing, tympanic membrane, ear canal and external ear normal.     Left Ear: Hearing, tympanic membrane, ear canal and external ear normal.     Nose: Congestion and rhinorrhea present. Rhinorrhea is clear.     Right Sinus: No maxillary sinus tenderness or frontal sinus tenderness.     Left Sinus: No maxillary sinus tenderness or frontal sinus tenderness.     Mouth/Throat:     Lips: Pink.     Mouth: Mucous membranes are moist.     Pharynx: Uvula midline. No oropharyngeal exudate or posterior oropharyngeal erythema.     Tonsils: No tonsillar exudate.  Eyes:     Conjunctiva/sclera: Conjunctivae normal.     Pupils: Pupils are equal, round, and reactive to light.  Cardiovascular:     Rate and Rhythm: Normal rate and regular rhythm.     Heart sounds: S1 normal and S2 normal. No murmur heard. Pulmonary:     Effort: Pulmonary effort is normal. No respiratory distress.     Breath sounds: Examination of the right-upper field reveals wheezing. Examination of the left-upper field reveals wheezing. Wheezing (Rare inspiratory wheeze) present. No decreased breath sounds, rhonchi or rales.  Abdominal:     General: Bowel sounds are normal.     Palpations: Abdomen is soft.     Tenderness: There is no abdominal tenderness.  Musculoskeletal:        General: No swelling.     Cervical back: Neck supple.  Lymphadenopathy:     Head:     Right side of head: No submental, submandibular, tonsillar, preauricular or posterior auricular adenopathy.     Left side of head: No submental, submandibular, tonsillar, preauricular or posterior auricular adenopathy.     Cervical: No cervical adenopathy.     Right cervical: No superficial cervical adenopathy.    Left cervical: No superficial cervical adenopathy.  Skin:    General: Skin is warm and dry.     Capillary Refill: Capillary refill takes less than 2 seconds.     Findings: No rash.   Neurological:     Mental Status: He is alert and oriented to person, place, and time.  Psychiatric:        Mood and Affect: Mood normal.      UC Treatments / Results  Labs (all labs ordered are listed, but only abnormal results are displayed) Labs Reviewed - No data to display  EKG   Radiology No results found.  Procedures Procedures (including critical care time)  Medications Ordered in UC Medications  triamcinolone  acetonide (KENALOG -40) injection 40 mg (40 mg Intramuscular Given 08/30/24 1153)    Initial Impression / Assessment and Plan / UC Course  I have reviewed the triage vital signs and the nursing notes.  Pertinent labs & imaging results that were available during my care of the patient were reviewed by me and considered in my medical decision making (see chart for details).  Plan of Care (see discharge instructions for additional patient precautions and education): Viral colitis with influenza, fever, cough, body aches: Tamiflu  75 mg twice daily for 5 days.  Get plenty of fluids and rest.  Promethazine  DM, 5 mL, every 6 hours if needed for cough.  Continue Ventolin /albuterol  inhaler, 2 puffs, every 4 hours if needed for wheezing.  Patient has an inhaler at home and does not need a refill right now.  Kenalog  40 mg injection now.  Patient is retired and does not need a work excuse.  Follow-up here or with primary care if  symptoms do not improve, worsen or new symptoms occur.  I reviewed the plan of care with the patient and/or the patient's guardian.  The patient and/or guardian had time to ask questions and acknowledged that the questions were answered.  Final Clinical Impressions(s) / UC Diagnoses   Final diagnoses:  Influenza  Acute cough  Fever, unspecified  Generalized body aches  Acute viral bronchitis     Discharge Instructions      Viral colitis with influenza, fever, cough, body aches: Tamiflu  75 mg twice daily for 5 days.  Get plenty of fluids  and rest.  Promethazine  DM, 5 mL, every 6 hours if needed for cough.  Continue Ventolin /albuterol  inhaler, 2 puffs, every 4 hours if needed for wheezing.  Patient has an inhaler at home and does not need a refill right now.  Kenalog  40 mg injection now.  Patient is retired and does not need a work excuse.  Follow-up here or with primary care if symptoms do not improve, worsen or new symptoms occur.     ED Prescriptions     Medication Sig Dispense Auth. Provider   promethazine -dextromethorphan (PROMETHAZINE -DM) 6.25-15 MG/5ML syrup Take 5 mLs by mouth 4 (four) times daily as needed for cough. Do not use and drive - May make drowsy. 118 mL Ival Domino, FNP   oseltamivir  (TAMIFLU ) 75 MG capsule Take 1 capsule (75 mg total) by mouth every 12 (twelve) hours. 10 capsule Ival Domino, FNP      PDMP not reviewed this encounter.    [1]  Social History Tobacco Use   Smoking status: Never    Passive exposure: Never   Smokeless tobacco: Never  Vaping Use   Vaping status: Never Used  Substance Use Topics   Alcohol use: Not Currently   Drug use: Not Currently    Types: Marijuana     Ival Domino, FNP 08/30/24 1206  "

## 2024-11-25 ENCOUNTER — Ambulatory Visit (HOSPITAL_BASED_OUTPATIENT_CLINIC_OR_DEPARTMENT_OTHER): Admitting: Student
